# Patient Record
Sex: Female | Born: 1960 | Race: Black or African American | Hispanic: No | Marital: Married | Smoking: Never smoker
Health system: Southern US, Community
[De-identification: ages and names within clinical notes are randomized; demographics above are authoritative.]

## PROBLEM LIST (undated history)

## (undated) DIAGNOSIS — R7303 Prediabetes: Secondary | ICD-10-CM

## (undated) DIAGNOSIS — M549 Dorsalgia, unspecified: Secondary | ICD-10-CM

## (undated) DIAGNOSIS — R002 Palpitations: Secondary | ICD-10-CM

## (undated) DIAGNOSIS — R0602 Shortness of breath: Secondary | ICD-10-CM

## (undated) DIAGNOSIS — M2550 Pain in unspecified joint: Secondary | ICD-10-CM

## (undated) DIAGNOSIS — G4733 Obstructive sleep apnea (adult) (pediatric): Secondary | ICD-10-CM

## (undated) DIAGNOSIS — K5900 Constipation, unspecified: Secondary | ICD-10-CM

## (undated) DIAGNOSIS — K644 Residual hemorrhoidal skin tags: Secondary | ICD-10-CM

## (undated) DIAGNOSIS — R079 Chest pain, unspecified: Secondary | ICD-10-CM

## (undated) DIAGNOSIS — R5383 Other fatigue: Secondary | ICD-10-CM

## (undated) DIAGNOSIS — Z87442 Personal history of urinary calculi: Secondary | ICD-10-CM

## (undated) DIAGNOSIS — M797 Fibromyalgia: Secondary | ICD-10-CM

## (undated) DIAGNOSIS — K219 Gastro-esophageal reflux disease without esophagitis: Secondary | ICD-10-CM

## (undated) DIAGNOSIS — F329 Major depressive disorder, single episode, unspecified: Secondary | ICD-10-CM

## (undated) DIAGNOSIS — F419 Anxiety disorder, unspecified: Secondary | ICD-10-CM

## (undated) DIAGNOSIS — K648 Other hemorrhoids: Secondary | ICD-10-CM

## (undated) DIAGNOSIS — D219 Benign neoplasm of connective and other soft tissue, unspecified: Secondary | ICD-10-CM

## (undated) DIAGNOSIS — K5792 Diverticulitis of intestine, part unspecified, without perforation or abscess without bleeding: Secondary | ICD-10-CM

## (undated) DIAGNOSIS — K8020 Calculus of gallbladder without cholecystitis without obstruction: Secondary | ICD-10-CM

## (undated) DIAGNOSIS — G894 Chronic pain syndrome: Secondary | ICD-10-CM

## (undated) DIAGNOSIS — I10 Essential (primary) hypertension: Secondary | ICD-10-CM

## (undated) HISTORY — DX: Chronic pain syndrome: G89.4

## (undated) HISTORY — DX: Obstructive sleep apnea (adult) (pediatric): G47.33

## (undated) HISTORY — DX: Gastro-esophageal reflux disease without esophagitis: K21.9

## (undated) HISTORY — DX: Benign neoplasm of connective and other soft tissue, unspecified: D21.9

## (undated) HISTORY — DX: Constipation, unspecified: K59.00

## (undated) HISTORY — DX: Calculus of gallbladder without cholecystitis without obstruction: K80.20

## (undated) HISTORY — DX: Major depressive disorder, single episode, unspecified: F32.9

## (undated) HISTORY — DX: Dorsalgia, unspecified: M54.9

## (undated) HISTORY — PX: APPENDECTOMY: SHX54

## (undated) HISTORY — DX: Anxiety disorder, unspecified: F41.9

## (undated) HISTORY — DX: Pain in unspecified joint: M25.50

## (undated) HISTORY — DX: Shortness of breath: R06.02

## (undated) HISTORY — DX: Prediabetes: R73.03

## (undated) HISTORY — DX: Other fatigue: R53.83

## (undated) HISTORY — DX: Diverticulitis of intestine, part unspecified, without perforation or abscess without bleeding: K57.92

## (undated) HISTORY — DX: Personal history of urinary calculi: Z87.442

## (undated) HISTORY — DX: Morbid (severe) obesity due to excess calories: E66.01

## (undated) HISTORY — DX: Chest pain, unspecified: R07.9

## (undated) HISTORY — DX: Fibromyalgia: M79.7

## (undated) HISTORY — DX: Essential (primary) hypertension: I10

## (undated) HISTORY — DX: Palpitations: R00.2

## (undated) HISTORY — PX: DILATION AND CURETTAGE OF UTERUS: SHX78

---

## 1994-08-15 HISTORY — PX: TUBAL LIGATION: SHX77

## 2002-08-15 DIAGNOSIS — I10 Essential (primary) hypertension: Secondary | ICD-10-CM

## 2002-08-15 HISTORY — PX: COLONOSCOPY: SHX174

## 2002-08-15 HISTORY — DX: Essential (primary) hypertension: I10

## 2003-09-10 ENCOUNTER — Emergency Department (HOSPITAL_COMMUNITY): Admission: EM | Admit: 2003-09-10 | Discharge: 2003-09-10 | Payer: Self-pay | Admitting: Emergency Medicine

## 2003-09-30 ENCOUNTER — Encounter: Admission: RE | Admit: 2003-09-30 | Discharge: 2003-09-30 | Payer: Self-pay | Admitting: Obstetrics and Gynecology

## 2003-11-13 ENCOUNTER — Encounter: Admission: RE | Admit: 2003-11-13 | Discharge: 2003-11-13 | Payer: Self-pay | Admitting: Obstetrics and Gynecology

## 2003-11-27 ENCOUNTER — Encounter: Admission: RE | Admit: 2003-11-27 | Discharge: 2003-11-27 | Payer: Self-pay | Admitting: Internal Medicine

## 2004-09-14 ENCOUNTER — Encounter: Admission: RE | Admit: 2004-09-14 | Discharge: 2004-09-14 | Payer: Self-pay | Admitting: Obstetrics and Gynecology

## 2004-11-08 ENCOUNTER — Encounter: Admission: RE | Admit: 2004-11-08 | Discharge: 2004-11-08 | Payer: Self-pay | Admitting: Obstetrics and Gynecology

## 2005-09-15 ENCOUNTER — Encounter: Admission: RE | Admit: 2005-09-15 | Discharge: 2005-09-15 | Payer: Self-pay | Admitting: Internal Medicine

## 2006-08-03 ENCOUNTER — Ambulatory Visit (HOSPITAL_COMMUNITY): Admission: RE | Admit: 2006-08-03 | Discharge: 2006-08-03 | Payer: Self-pay | Admitting: Obstetrics and Gynecology

## 2007-04-12 ENCOUNTER — Emergency Department (HOSPITAL_COMMUNITY): Admission: EM | Admit: 2007-04-12 | Discharge: 2007-04-12 | Payer: Self-pay | Admitting: Emergency Medicine

## 2007-06-13 ENCOUNTER — Encounter: Admission: RE | Admit: 2007-06-13 | Discharge: 2007-06-13 | Payer: Self-pay | Admitting: Obstetrics and Gynecology

## 2008-06-24 ENCOUNTER — Encounter: Admission: RE | Admit: 2008-06-24 | Discharge: 2008-06-24 | Payer: Self-pay | Admitting: Obstetrics and Gynecology

## 2008-08-07 IMAGING — CR DG CHEST 1V PORT
1 series · 1 of 1 positions shown · non-contrast
Comparison: 11/27/03.

CLINICAL DATA: Left-sided chest pain.  
 PORTABLE CHEST - 1 VIEW:

[view not recorded]
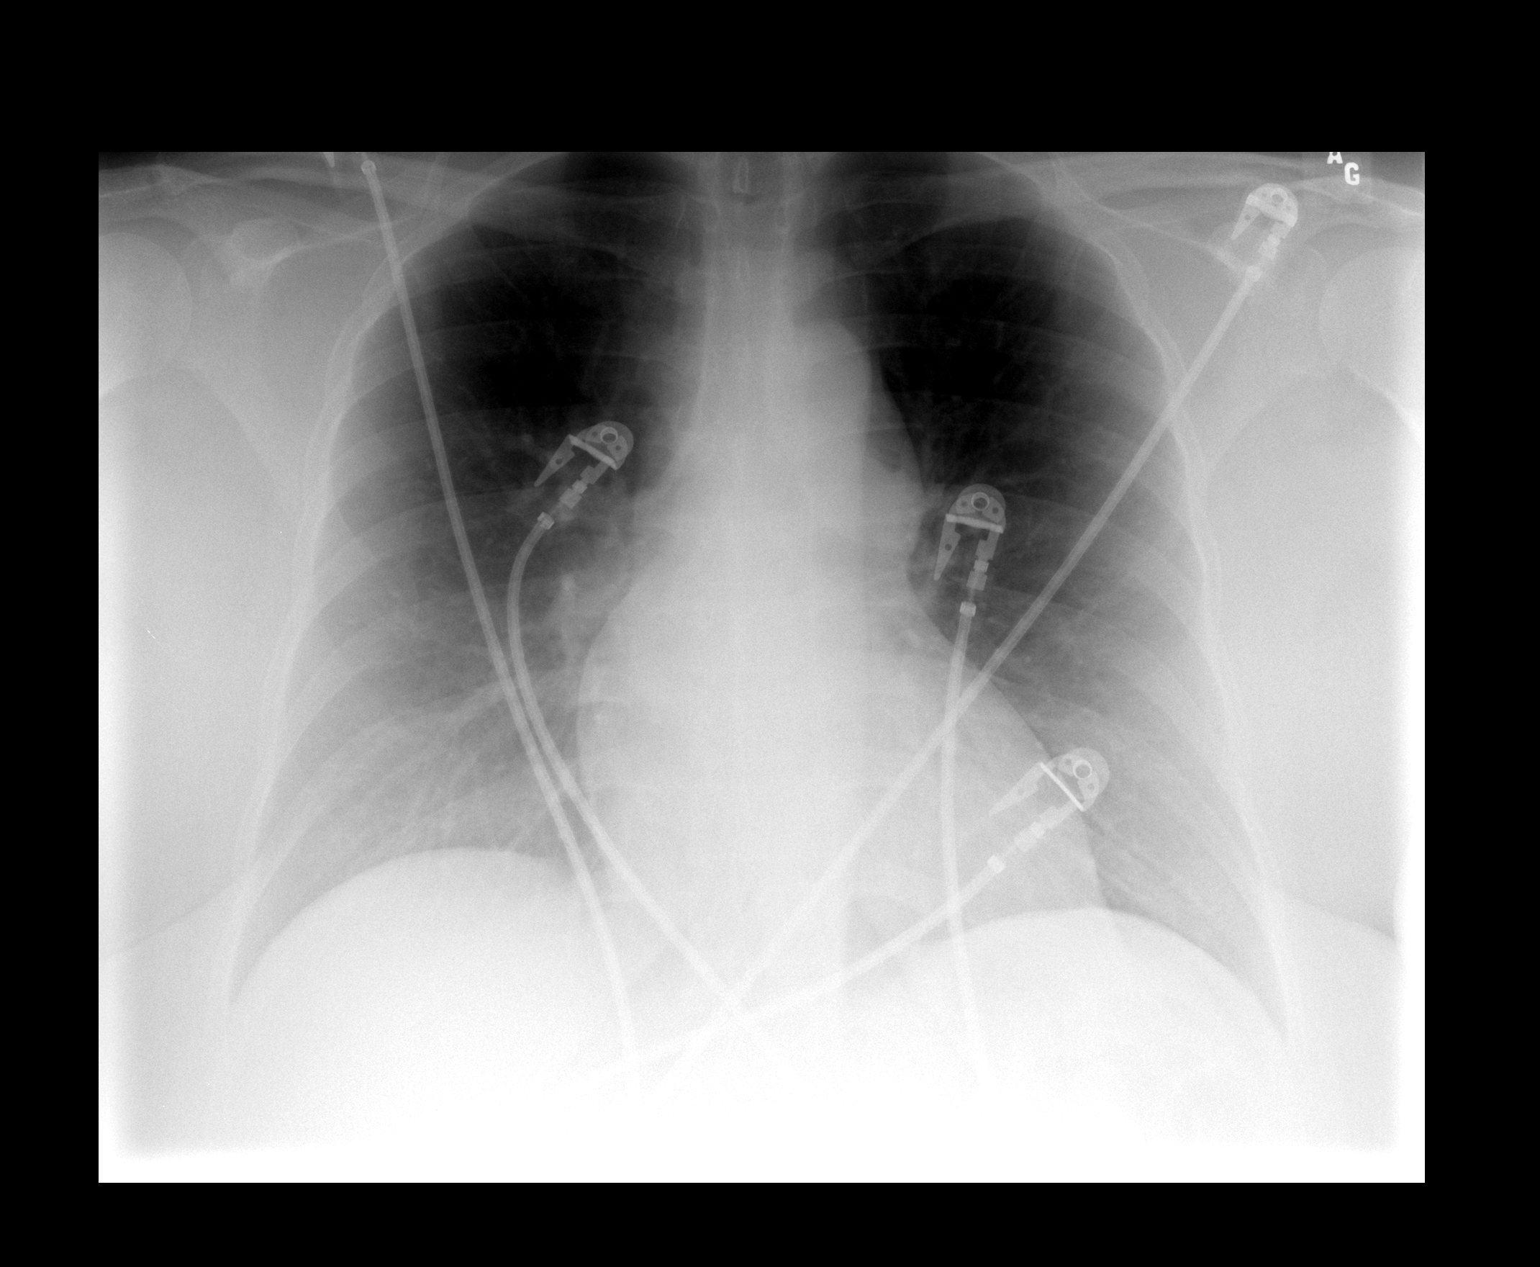

[1 of 1 positions shown; findings below may reference images not displayed]

FINDINGS: The heart size and mediastinal contours are within normal limits.  Both lungs are clear.
IMPRESSION: No acute disease.

## 2008-08-13 ENCOUNTER — Emergency Department (HOSPITAL_COMMUNITY): Admission: EM | Admit: 2008-08-13 | Discharge: 2008-08-14 | Payer: Self-pay | Admitting: Emergency Medicine

## 2008-08-15 DIAGNOSIS — D219 Benign neoplasm of connective and other soft tissue, unspecified: Secondary | ICD-10-CM

## 2008-08-15 DIAGNOSIS — M797 Fibromyalgia: Secondary | ICD-10-CM

## 2008-08-15 HISTORY — DX: Benign neoplasm of connective and other soft tissue, unspecified: D21.9

## 2008-08-15 HISTORY — DX: Fibromyalgia: M79.7

## 2009-10-05 ENCOUNTER — Encounter: Admission: RE | Admit: 2009-10-05 | Discharge: 2009-10-05 | Payer: Self-pay | Admitting: Obstetrics and Gynecology

## 2010-08-15 DIAGNOSIS — F32A Depression, unspecified: Secondary | ICD-10-CM

## 2010-08-15 DIAGNOSIS — F419 Anxiety disorder, unspecified: Secondary | ICD-10-CM

## 2010-08-15 HISTORY — DX: Anxiety disorder, unspecified: F41.9

## 2010-08-15 HISTORY — DX: Depression, unspecified: F32.A

## 2010-09-05 ENCOUNTER — Encounter: Payer: Self-pay | Admitting: Obstetrics and Gynecology

## 2011-01-18 ENCOUNTER — Other Ambulatory Visit: Payer: Self-pay | Admitting: Obstetrics and Gynecology

## 2011-01-18 DIAGNOSIS — Z1231 Encounter for screening mammogram for malignant neoplasm of breast: Secondary | ICD-10-CM

## 2011-01-27 ENCOUNTER — Ambulatory Visit
Admission: RE | Admit: 2011-01-27 | Discharge: 2011-01-27 | Disposition: A | Discharge status: Home / Self Care | Payer: BC Managed Care – PPO | Source: Ambulatory Visit | Attending: Obstetrics and Gynecology | Admitting: Obstetrics and Gynecology

## 2011-01-27 DIAGNOSIS — Z1231 Encounter for screening mammogram for malignant neoplasm of breast: Secondary | ICD-10-CM

## 2011-02-01 ENCOUNTER — Other Ambulatory Visit: Payer: Self-pay | Admitting: Obstetrics and Gynecology

## 2011-02-01 ENCOUNTER — Other Ambulatory Visit (HOSPITAL_COMMUNITY)
Admission: RE | Admit: 2011-02-01 | Discharge: 2011-02-01 | Disposition: A | Discharge status: Home / Self Care | Payer: BC Managed Care – PPO | Source: Ambulatory Visit | Attending: Obstetrics and Gynecology | Admitting: Obstetrics and Gynecology

## 2011-02-01 DIAGNOSIS — Z01419 Encounter for gynecological examination (general) (routine) without abnormal findings: Secondary | ICD-10-CM | POA: Insufficient documentation

## 2011-05-20 LAB — DIFFERENTIAL
Basophils Absolute: 0 10*3/uL (ref 0.0–0.1)
Basophils Relative: 1 % (ref 0–1)
Eosinophils Absolute: 0.1 10*3/uL (ref 0.0–0.7)
Eosinophils Relative: 1 % (ref 0–5)
Lymphocytes Relative: 31 % (ref 12–46)
Lymphs Abs: 2 10*3/uL (ref 0.7–4.0)
Monocytes Absolute: 0.5 10*3/uL (ref 0.1–1.0)
Monocytes Relative: 7 % (ref 3–12)
Neutro Abs: 3.9 10*3/uL (ref 1.7–7.7)
Neutrophils Relative %: 60 % (ref 43–77)

## 2011-05-20 LAB — COMPREHENSIVE METABOLIC PANEL
ALT: 20 U/L (ref 0–35)
AST: 22 U/L (ref 0–37)
Albumin: 3.6 g/dL (ref 3.5–5.2)
Alkaline Phosphatase: 97 U/L (ref 39–117)
BUN: 14 mg/dL (ref 6–23)
CO2: 27 mEq/L (ref 19–32)
Calcium: 9.7 mg/dL (ref 8.4–10.5)
Chloride: 102 mEq/L (ref 96–112)
Creatinine, Ser: 0.76 mg/dL (ref 0.4–1.2)
GFR calc Af Amer: >60 mL/min (ref >60)
GFR calc non Af Amer: >60 mL/min (ref >60)
Glucose, Bld: 114 mg/dL — ABNORMAL HIGH (ref 70–99)
Potassium: 3.8 mEq/L (ref 3.5–5.1)
Sodium: 140 mEq/L (ref 135–145)
Total Bilirubin: 0.8 mg/dL (ref 0.3–1.2)
Total Protein: 7.2 g/dL (ref 6.0–8.3)

## 2011-05-20 LAB — POCT I-STAT, CHEM 8
BUN: 15 mg/dL (ref 6–23)
Calcium, Ion: 1.16 mmol/L (ref 1.12–1.32)
Chloride: 104 mEq/L (ref 96–112)
Creatinine, Ser: 0.8 mg/dL (ref 0.4–1.2)
Glucose, Bld: 105 mg/dL — ABNORMAL HIGH (ref 70–99)
HCT: 41 % (ref 36.0–46.0)
Hemoglobin: 13.9 g/dL (ref 12.0–15.0)
Potassium: 3.7 mEq/L (ref 3.5–5.1)
Sodium: 140 mEq/L (ref 135–145)
TCO2: 26 mmol/L (ref 0–100)

## 2011-05-20 LAB — URINALYSIS, ROUTINE W REFLEX MICROSCOPIC
Bilirubin Urine: NEGATIVE
Glucose, UA: NEGATIVE mg/dL
Hgb urine dipstick: NEGATIVE
Ketones, ur: NEGATIVE mg/dL
Nitrite: NEGATIVE
Protein, ur: NEGATIVE mg/dL
Specific Gravity, Urine: 1.01 (ref 1.005–1.030)
Urobilinogen, UA: 0.2 mg/dL (ref 0.0–1.0)
pH: 6 (ref 5.0–8.0)

## 2011-05-20 LAB — POCT CARDIAC MARKERS
CKMB, poc: <1 ng/mL — ABNORMAL LOW (ref 1.0–8.0)
Myoglobin, poc: 29.4 ng/mL (ref 12–200)
Troponin i, poc: <0.05 ng/mL (ref 0.00–0.09)

## 2011-05-20 LAB — CBC
HCT: 39 % (ref 36.0–46.0)
Hemoglobin: 13.1 g/dL (ref 12.0–15.0)
MCHC: 33.5 g/dL (ref 30.0–36.0)
MCV: 77.2 fL — ABNORMAL LOW (ref 78.0–100.0)
Platelets: 335 10*3/uL (ref 150–400)
RBC: 5.06 MIL/uL (ref 3.87–5.11)
RDW: 14.6 % (ref 11.5–15.5)
WBC: 6.5 10*3/uL (ref 4.0–10.5)

## 2011-05-20 LAB — LIPASE, BLOOD: Lipase: 20 U/L (ref 11–59)

## 2011-05-27 LAB — APTT: aPTT: 30

## 2011-05-27 LAB — ETHANOL: Alcohol, Ethyl (B): <5

## 2011-05-27 LAB — I-STAT 8, (EC8 V) (CONVERTED LAB)
Acid-Base Excess: 3 — ABNORMAL HIGH
BUN: 14
Bicarbonate: 29 — ABNORMAL HIGH
Chloride: 104
Glucose, Bld: 101 — ABNORMAL HIGH
HCT: 44
Hemoglobin: 15
Operator id: 285491
Potassium: 4
Sodium: 138
TCO2: 30
pCO2, Ven: 46.6
pH, Ven: 7.402 — ABNORMAL HIGH

## 2011-05-27 LAB — DIFFERENTIAL
Basophils Absolute: 0
Basophils Relative: 0
Eosinophils Absolute: 0.1
Eosinophils Relative: 1
Lymphocytes Relative: 26
Lymphs Abs: 1.6
Monocytes Absolute: 0.5
Monocytes Relative: 8
Neutro Abs: 3.9
Neutrophils Relative %: 64

## 2011-05-27 LAB — CBC
HCT: 37.9
Hemoglobin: 12.5
MCHC: 33.1
MCV: 78.1
Platelets: 346
RBC: 4.85
RDW: 14.2 — ABNORMAL HIGH
WBC: 6.1

## 2011-05-27 LAB — POCT CARDIAC MARKERS
CKMB, poc: 1.4
CKMB, poc: <1 — ABNORMAL LOW
Myoglobin, poc: 49.3
Myoglobin, poc: 55.5
Operator id: 285491
Operator id: 285491
Troponin i, poc: <0.05
Troponin i, poc: <0.05

## 2011-05-27 LAB — POCT I-STAT CREATININE
Creatinine, Ser: 0.8
Operator id: 285491

## 2011-05-27 LAB — PROTIME-INR
INR: 0.9
Prothrombin Time: 12.5

## 2011-05-27 LAB — D-DIMER, QUANTITATIVE (NOT AT ARMC): D-Dimer, Quant: 0.23

## 2011-08-16 DIAGNOSIS — G4733 Obstructive sleep apnea (adult) (pediatric): Secondary | ICD-10-CM

## 2011-08-16 HISTORY — DX: Obstructive sleep apnea (adult) (pediatric): G47.33

## 2011-08-31 ENCOUNTER — Institutional Professional Consult (permissible substitution): Payer: BC Managed Care – PPO | Admitting: Pulmonary Disease

## 2011-09-02 ENCOUNTER — Institutional Professional Consult (permissible substitution): Payer: BC Managed Care – PPO | Admitting: Pulmonary Disease

## 2011-09-16 ENCOUNTER — Institutional Professional Consult (permissible substitution): Payer: BC Managed Care – PPO | Admitting: Pulmonary Disease

## 2011-10-05 ENCOUNTER — Encounter: Payer: Self-pay | Admitting: Pulmonary Disease

## 2011-10-06 ENCOUNTER — Institutional Professional Consult (permissible substitution): Payer: BC Managed Care – PPO | Admitting: Pulmonary Disease

## 2011-10-24 ENCOUNTER — Encounter: Payer: Self-pay | Admitting: Pulmonary Disease

## 2011-10-25 ENCOUNTER — Institutional Professional Consult (permissible substitution): Payer: BC Managed Care – PPO | Admitting: Pulmonary Disease

## 2013-02-13 ENCOUNTER — Encounter (HOSPITAL_COMMUNITY): Payer: Self-pay | Admitting: *Deleted

## 2013-02-13 ENCOUNTER — Emergency Department (HOSPITAL_COMMUNITY)
Admission: EM | Admit: 2013-02-13 | Discharge: 2013-02-13 | Disposition: A | Discharge status: Home / Self Care | Payer: BC Managed Care – PPO | Attending: Emergency Medicine | Admitting: Emergency Medicine

## 2013-02-13 DIAGNOSIS — G894 Chronic pain syndrome: Secondary | ICD-10-CM | POA: Insufficient documentation

## 2013-02-13 DIAGNOSIS — Z79899 Other long term (current) drug therapy: Secondary | ICD-10-CM | POA: Insufficient documentation

## 2013-02-13 DIAGNOSIS — I10 Essential (primary) hypertension: Secondary | ICD-10-CM | POA: Insufficient documentation

## 2013-02-13 DIAGNOSIS — K5900 Constipation, unspecified: Secondary | ICD-10-CM | POA: Insufficient documentation

## 2013-02-13 DIAGNOSIS — R1032 Left lower quadrant pain: Secondary | ICD-10-CM | POA: Insufficient documentation

## 2013-02-13 DIAGNOSIS — R109 Unspecified abdominal pain: Secondary | ICD-10-CM

## 2013-02-13 DIAGNOSIS — Z8669 Personal history of other diseases of the nervous system and sense organs: Secondary | ICD-10-CM | POA: Insufficient documentation

## 2013-02-13 DIAGNOSIS — Z8679 Personal history of other diseases of the circulatory system: Secondary | ICD-10-CM | POA: Insufficient documentation

## 2013-02-13 DIAGNOSIS — F3289 Other specified depressive episodes: Secondary | ICD-10-CM | POA: Insufficient documentation

## 2013-02-13 DIAGNOSIS — F329 Major depressive disorder, single episode, unspecified: Secondary | ICD-10-CM | POA: Insufficient documentation

## 2013-02-13 DIAGNOSIS — Z862 Personal history of diseases of the blood and blood-forming organs and certain disorders involving the immune mechanism: Secondary | ICD-10-CM | POA: Insufficient documentation

## 2013-02-13 DIAGNOSIS — K921 Melena: Secondary | ICD-10-CM | POA: Insufficient documentation

## 2013-02-13 LAB — URINALYSIS, ROUTINE W REFLEX MICROSCOPIC
Bilirubin Urine: NEGATIVE
Glucose, UA: NEGATIVE mg/dL
Hgb urine dipstick: NEGATIVE
Ketones, ur: NEGATIVE mg/dL
Leukocytes, UA: NEGATIVE
Nitrite: NEGATIVE
Protein, ur: NEGATIVE mg/dL
Specific Gravity, Urine: 1.009 (ref 1.005–1.030)
Urobilinogen, UA: 0.2 mg/dL (ref 0.0–1.0)
pH: 7 (ref 5.0–8.0)

## 2013-02-13 LAB — CBC WITH DIFFERENTIAL/PLATELET
Basophils Absolute: 0 10*3/uL (ref 0.0–0.1)
Basophils Relative: 0 % (ref 0–1)
Eosinophils Absolute: 0.1 10*3/uL (ref 0.0–0.7)
Eosinophils Relative: 1 % (ref 0–5)
HCT: 36.7 % (ref 36.0–46.0)
Hemoglobin: 11.5 g/dL — ABNORMAL LOW (ref 12.0–15.0)
Lymphocytes Relative: 29 % (ref 12–46)
Lymphs Abs: 1.3 10*3/uL (ref 0.7–4.0)
MCH: 24.3 pg — ABNORMAL LOW (ref 26.0–34.0)
MCHC: 31.3 g/dL (ref 30.0–36.0)
MCV: 77.4 fL — ABNORMAL LOW (ref 78.0–100.0)
Monocytes Absolute: 0.5 10*3/uL (ref 0.1–1.0)
Monocytes Relative: 11 % (ref 3–12)
Neutro Abs: 2.8 10*3/uL (ref 1.7–7.7)
Neutrophils Relative %: 59 % (ref 43–77)
Platelets: 261 10*3/uL (ref 150–400)
RBC: 4.74 MIL/uL (ref 3.87–5.11)
RDW: 13.9 % (ref 11.5–15.5)
WBC: 4.7 10*3/uL (ref 4.0–10.5)

## 2013-02-13 LAB — COMPREHENSIVE METABOLIC PANEL
ALT: 15 U/L (ref 0–35)
AST: 16 U/L (ref 0–37)
Albumin: 3.7 g/dL (ref 3.5–5.2)
Alkaline Phosphatase: 105 U/L (ref 39–117)
BUN: 7 mg/dL (ref 6–23)
CO2: 28 mEq/L (ref 19–32)
Calcium: 9.3 mg/dL (ref 8.4–10.5)
Chloride: 104 mEq/L (ref 96–112)
Creatinine, Ser: 0.73 mg/dL (ref 0.50–1.10)
GFR calc Af Amer: >90 mL/min (ref >90)
GFR calc non Af Amer: >90 mL/min (ref >90)
Glucose, Bld: 104 mg/dL — ABNORMAL HIGH (ref 70–99)
Potassium: 3.8 mEq/L (ref 3.5–5.1)
Sodium: 141 mEq/L (ref 135–145)
Total Bilirubin: 0.5 mg/dL (ref 0.3–1.2)
Total Protein: 7.5 g/dL (ref 6.0–8.3)

## 2013-02-13 LAB — POCT I-STAT TROPONIN I: Troponin i, poc: 0 ng/mL (ref 0.00–0.08)

## 2013-02-13 MED ORDER — METRONIDAZOLE 500 MG PO TABS: 500.0000 mg | ORAL_TABLET | Freq: Three times a day (TID) | ORAL | Status: DC

## 2013-02-13 MED ORDER — CIPROFLOXACIN HCL 500 MG PO TABS: 500.0000 mg | ORAL_TABLET | Freq: Two times a day (BID) | ORAL | Status: DC

## 2013-02-13 MED ORDER — OMEPRAZOLE 20 MG PO CPDR: 20.0000 mg | DELAYED_RELEASE_CAPSULE | Freq: Every day | ORAL | Status: DC

## 2013-02-13 NOTE — ED Notes (Signed)
Pt c/o rectal bleeding when having frequent stools, and when she has to strain. Pt also c/o abd pain x 2wks. Bleeding bright red in color.

## 2013-02-13 NOTE — ED Notes (Signed)
Pt ambulated back to room and then reported having chest pain on arrival to the room.

## 2013-02-13 NOTE — ED Provider Notes (Signed)
Medical screening examination/treatment/procedure(s) were performed by non-physician practitioner and as supervising physician I was immediately available for consultation/collaboration.  Liannah Yarbough, MD 02/13/13 1617 

## 2013-02-13 NOTE — ED Notes (Signed)
Discharge instructions reviewed. Pt verbalized understanding.  

## 2013-02-13 NOTE — ED Notes (Signed)
Pt reports having intermittent abd pains, also having lower and left side back pains, generalized abd pain and bright red rectal bleeding.

## 2013-02-13 NOTE — ED Provider Notes (Signed)
History    CSN: [id number] Arrival date & time 02/13/13  1141  First MD Initiated Contact with Patient 02/13/13 1232     Chief Complaint  Patient presents with  . Abdominal Pain  . Rectal Bleeding   (Consider location/radiation/quality/duration/timing/severity/associated sxs/prior Treatment) Patient is a 52 y.o. female presenting with abdominal pain and hematochezia. The history is provided by the patient. No language interpreter was used.  Abdominal Pain This is a new problem. Associated symptoms include abdominal pain. Pertinent negatives include no chills, fever, nausea or vomiting. Associated symptoms comments: Pain limited to epigastrium for the past 2 weeks associated with bloating. No nausea or vomiting, fever or diarrhea. She has seen bright red blood in her normal color stool with a history of external hemorrhoids. No cough, chest pain, SOB. Marland Kitchen Nothing aggravates the symptoms.  Rectal Bleeding Associated symptoms: abdominal pain   Associated symptoms: no fever and no vomiting    Past Medical History  Diagnosis Date  . Fibromyalgia   . Hypertension   . Chronic pain syndrome   . OSA (obstructive sleep apnea)   . Morbid obesity   . Depression   . Anemia    Past Surgical History  Procedure Laterality Date  . Appendectomy     Family History  Problem Relation Age of Onset  . Hypertension Father   . Alzheimer's disease Father   . Heart disease Father   . Mental illness Mother   . Breast cancer Sister    History  Substance Use Topics  . Smoking status: Not on file  . Smokeless tobacco: Not on file  . Alcohol Use: No   OB History   Grav Para Term Preterm Abortions TAB SAB Ect Mult Living                 Review of Systems  Constitutional: Negative for fever, chills and appetite change.  HENT: Negative.   Respiratory: Negative.   Cardiovascular: Negative.   Gastrointestinal: Positive for abdominal pain, constipation, blood in stool and hematochezia. Negative  for nausea, vomiting and diarrhea.  Genitourinary: Negative for dysuria and flank pain.  Musculoskeletal: Negative.  Negative for back pain.  Skin: Negative.   Neurological: Negative.     Allergies  Codeine  Home Medications   Current Outpatient Rx  Name  Route  Sig  Dispense  Refill  . furosemide (LASIX) 40 MG tablet   Oral   Take 40 mg by mouth daily.         Marland Kitchen HYDROcodone-acetaminophen (NORCO) 5-325 MG per tablet   Oral   Take 1 tablet by mouth every 8 (eight) hours as needed for pain.          Marland Kitchen LORazepam (ATIVAN) 0.5 MG tablet   Oral   Take 0.5 mg by mouth 2 (two) times daily.          . Melatonin 5 MG CAPS   Oral   Take 1 capsule by mouth at bedtime.         . metoprolol succinate (TOPROL-XL) 50 MG 24 hr tablet   Oral   Take 50 mg by mouth daily. Take with or immediately following a meal.         . Multiple Vitamin (MULTIVITAMIN) capsule   Oral   Take 1 capsule by mouth daily.          BP 142/88  Pulse 67  Temp(Src) 98.3 F (36.8 C) (Oral)  Resp 15  SpO2 98% Physical Exam  Constitutional: She  is oriented to person, place, and time. She appears well-developed and well-nourished.  HENT:  Head: Normocephalic.  Neck: Normal range of motion. Neck supple.  Cardiovascular: Normal rate and regular rhythm.   Pulmonary/Chest: Effort normal and breath sounds normal.  Abdominal: Soft. Bowel sounds are normal. There is tenderness. There is no rebound and no guarding.  Epigastric tenderness that is mild. She has LLQ abdominal pain with guarding. BS active. No notable distention to soft abdomen.   Musculoskeletal: Normal range of motion.  Neurological: She is alert and oriented to person, place, and time.  Skin: Skin is warm and dry. No rash noted.  Psychiatric: She has a normal mood and affect.    ED Course  Procedures (including critical care time) Labs Reviewed  CBC WITH DIFFERENTIAL - Abnormal; Notable for the following:    Hemoglobin 11.5 (*)     MCV 77.4 (*)    MCH 24.3 (*)    All other components within normal limits  COMPREHENSIVE METABOLIC PANEL - Abnormal; Notable for the following:    Glucose, Bld 104 (*)    All other components within normal limits  URINALYSIS, ROUTINE W REFLEX MICROSCOPIC  POCT I-STAT TROPONIN I   Results for orders placed during the hospital encounter of 02/13/13  CBC WITH DIFFERENTIAL      Result Value Range   WBC 4.7  4.0 - 10.5 K/uL   RBC 4.74  3.87 - 5.11 MIL/uL   Hemoglobin 11.5 (*) 12.0 - 15.0 g/dL   HCT 16.1  09.6 - 04.5 %   MCV 77.4 (*) 78.0 - 100.0 fL   MCH 24.3 (*) 26.0 - 34.0 pg   MCHC 31.3  30.0 - 36.0 g/dL   RDW 40.9  81.1 - 91.4 %   Platelets 261  150 - 400 K/uL   Neutrophils Relative % 59  43 - 77 %   Neutro Abs 2.8  1.7 - 7.7 K/uL   Lymphocytes Relative 29  12 - 46 %   Lymphs Abs 1.3  0.7 - 4.0 K/uL   Monocytes Relative 11  3 - 12 %   Monocytes Absolute 0.5  0.1 - 1.0 K/uL   Eosinophils Relative 1  0 - 5 %   Eosinophils Absolute 0.1  0.0 - 0.7 K/uL   Basophils Relative 0  0 - 1 %   Basophils Absolute 0.0  0.0 - 0.1 K/uL  COMPREHENSIVE METABOLIC PANEL      Result Value Range   Sodium 141  135 - 145 mEq/L   Potassium 3.8  3.5 - 5.1 mEq/L   Chloride 104  96 - 112 mEq/L   CO2 28  19 - 32 mEq/L   Glucose, Bld 104 (*) 70 - 99 mg/dL   BUN 7  6 - 23 mg/dL   Creatinine, Ser 7.82  0.50 - 1.10 mg/dL   Calcium 9.3  8.4 - 95.6 mg/dL   Total Protein 7.5  6.0 - 8.3 g/dL   Albumin 3.7  3.5 - 5.2 g/dL   AST 16  0 - 37 U/L   ALT 15  0 - 35 U/L   Alkaline Phosphatase 105  39 - 117 U/L   Total Bilirubin 0.5  0.3 - 1.2 mg/dL   GFR calc non Af Amer >90  >90 mL/min   GFR calc Af Amer >90  >90 mL/min  URINALYSIS, ROUTINE W REFLEX MICROSCOPIC      Result Value Range   Color, Urine YELLOW  YELLOW   APPearance CLEAR  CLEAR   Specific Gravity, Urine 1.009  1.005 - 1.030   pH 7.0  5.0 - 8.0   Glucose, UA NEGATIVE  NEGATIVE mg/dL   Hgb urine dipstick NEGATIVE  NEGATIVE   Bilirubin Urine  NEGATIVE  NEGATIVE   Ketones, ur NEGATIVE  NEGATIVE mg/dL   Protein, ur NEGATIVE  NEGATIVE mg/dL   Urobilinogen, UA 0.2  0.0 - 1.0 mg/dL   Nitrite NEGATIVE  NEGATIVE   Leukocytes, UA NEGATIVE  NEGATIVE  POCT I-STAT TROPONIN I      Result Value Range   Troponin i, poc 0.00  0.00 - 0.08 ng/mL   Comment 3             No results found. No diagnosis found. 1. Abdominal pain MDM  LLQ pain on exam with history of diverticulitis. With no fever, N, V or diarrhea feel this is stable to treat as outpatient with Cipro and Flagyl. Will refer to GI. Recommend Prilosec for complaint of epigastric symptoms and bloating. Discussed symptoms that would prompt return to ED.  Arnoldo Hooker, PA-C 02/13/13 1422

## 2013-02-13 NOTE — ED Notes (Signed)
PT reports rectal bleeding today.

## 2013-04-05 ENCOUNTER — Encounter: Payer: Self-pay | Admitting: Nurse Practitioner

## 2013-04-05 ENCOUNTER — Ambulatory Visit (INDEPENDENT_AMBULATORY_CARE_PROVIDER_SITE_OTHER): Payer: BC Managed Care – PPO | Admitting: Nurse Practitioner

## 2013-04-05 VITALS — BP 142/68 | HR 76 | Resp 14 | Ht 59.0 in | Wt 206.8 lb

## 2013-04-05 DIAGNOSIS — M25559 Pain in unspecified hip: Secondary | ICD-10-CM

## 2013-04-05 DIAGNOSIS — Z0000 Encounter for general adult medical examination without abnormal findings: Secondary | ICD-10-CM

## 2013-04-05 DIAGNOSIS — Z01419 Encounter for gynecological examination (general) (routine) without abnormal findings: Secondary | ICD-10-CM

## 2013-04-05 LAB — POCT URINALYSIS DIPSTICK
Leukocytes, UA: NEGATIVE
Spec Grav, UA: 1.025
Urobilinogen, UA: NEGATIVE
pH, UA: 6

## 2013-04-05 NOTE — Patient Instructions (Signed)

## 2013-04-05 NOTE — Progress Notes (Signed)
51 y.o. G3, P3. Married Caucasian Fe here for annual exam.  LMP about 3 years. No vaso symptoms." Warm ' in general. No vaginal dryness. Pain in both lower quadrants that is intermittent and varies between right and left. Pain is dull achy and seems 'like ovarian pain'.  Noted  about 2 months ago. She also  Has history of uterine fibroids. She is also concerned about a flare of hemorrhoids and rectal bleeding in July related to episode of diverticulitis.  She needs another colonoscopy scheduled. She had appointment with Dr. Loreta Ave a few months ago and was getting ready to have done when she had chest pain. They cancelled the procedure and she saw the cardiologist and everything checked out normal. Thought episode was related to anxiety. She has not yet rescheduled to go back.  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 Daughter is patient here.  No LMP recorded. Patient is postmenopausal.          Sexually active: no  The current method of family planning is post menopausal status.    Exercising: no  The patient does not participate in regular exercise at present. Smoker:  no  Health Maintenance: Pap:  2011 normal (no history of abnormal) MMG:  1/ 2014 normal Colonoscopy:  2004 BMD:   2014 TDaP:  2011 Labs: PCP maintains all labs.    reports that she has never smoked. She has never used smokeless tobacco. She reports that she does not drink alcohol or use illicit drugs.  Past Medical History  Diagnosis Date  . Fibromyalgia   . Hypertension   . Chronic pain syndrome   . OSA (obstructive sleep apnea)   . Morbid obesity   . Depression   . Anemia   . Diverticulitis   . Anxiety     managed with medications   . Fibroid     uterine fibroids    Past Surgical History  Procedure Laterality Date  . Appendectomy    . Tubal ligation Bilateral   . Colonoscopy    . Colposcopy    . Vaginal delivery      x3    Current Outpatient Prescriptions  Medication Sig Dispense Refill  .  furosemide (LASIX) 40 MG tablet Take 40 mg by mouth daily.      Marland Kitchen HYDROcodone-acetaminophen (NORCO) 5-325 MG per tablet Take 1 tablet by mouth every 8 (eight) hours as needed for pain.       Marland Kitchen LORazepam (ATIVAN) 0.5 MG tablet Take 0.5 mg by mouth 2 (two) times daily.       . metoprolol succinate (TOPROL-XL) 50 MG 24 hr tablet Take 50 mg by mouth daily. Take with or immediately following a meal.      . omeprazole (PRILOSEC) 20 MG capsule Take 1 capsule (20 mg total) by mouth daily.  20 capsule  0   No current facility-administered medications for this visit.    Family History  Problem Relation Age of Onset  . Hypertension Father   . Alzheimer's disease Father   . Heart disease Father   . Diabetes Father   . Mental illness Mother   . Diabetes Mother   . Breast cancer Sister   . Diabetes Sister     ROS:  Pertinent items are noted in HPI.  Otherwise, a comprehensive ROS was negative.  Exam:   BP 142/68  Pulse 76  Resp 14  Ht 4\' 11"  (1.499 m)  Wt 206 lb 12.8 oz (93.804 kg)  BMI  41.75 kg/m2 Height: 4\' 11"  (149.9 cm)  Ht Readings from Last 3 Encounters:  04/05/13 4\' 11"  (1.499 m)    General appearance: alert, cooperative and appears stated age Head: Normocephalic, without obvious abnormality, atraumatic Neck: no adenopathy, supple, symmetrical, trachea midline and thyroid normal to inspection and palpation Lungs: clear to auscultation bilaterally Breasts: normal appearance, no masses or tenderness Heart: regular rate and rhythm Abdomen: soft, non-tender; no masses,  no organomegaly Extremities: extremities normal, atraumatic, no cyanosis or edema Skin: Skin color, texture, turgor normal. No rashes or lesions Lymph nodes: Cervical, supraclavicular, and axillary nodes normal. No abnormal inguinal nodes palpated Neurologic: Grossly normal   Pelvic: External genitalia:  no lesions              Urethra:  normal appearing urethra with no masses, tenderness or lesions               Bartholin's and Skene's: normal                 Vagina: normal appearing vagina with normal color and discharge, no lesions              Cervix: anteverted              Pap taken: yes Bimanual Exam:  Uterus:upper  normal size, contour, position, consistency, mobility, non-tender              Adnexa: no mass, fullness, tenderness               Rectovaginal: Confirms               Anus:  normal sphincter tone, no lesions  A:  Well Woman with normal exam  Menopausal with history of lower pelvic pain X 2  months  History of uterine fibroids  History of diverticulitis - flare in July  [facility] breast cancer with sister   P:   Pap smear as per guidelines Done today  Mammogram due 1/ 2015  Will get PUS/ maybe SHGM to evaluate pelvic pain  If negative she will go back to see Dr. Loreta Ave  Information given about BRCA testing  Counseled on breast self exam, adequate intake of calcium and vitamin D, diet and exercise return annually or prn  An After Visit Summary was printed and given to the patient.

## 2013-04-06 NOTE — Progress Notes (Signed)
Encounter reviewed by Dr. Brook Silva.  

## 2013-04-09 LAB — IPS PAP TEST WITH HPV

## 2013-04-15 ENCOUNTER — Other Ambulatory Visit: Payer: Self-pay | Admitting: Obstetrics & Gynecology

## 2013-04-15 DIAGNOSIS — Z1211 Encounter for screening for malignant neoplasm of colon: Secondary | ICD-10-CM

## 2013-04-15 DIAGNOSIS — N9489 Other specified conditions associated with female genital organs and menstrual cycle: Secondary | ICD-10-CM

## 2013-04-16 ENCOUNTER — Other Ambulatory Visit: Payer: Self-pay | Admitting: Nurse Practitioner

## 2013-04-16 DIAGNOSIS — N9489 Other specified conditions associated with female genital organs and menstrual cycle: Secondary | ICD-10-CM

## 2013-04-18 ENCOUNTER — Ambulatory Visit (INDEPENDENT_AMBULATORY_CARE_PROVIDER_SITE_OTHER): Payer: BC Managed Care – PPO | Admitting: Obstetrics & Gynecology

## 2013-04-18 ENCOUNTER — Ambulatory Visit (INDEPENDENT_AMBULATORY_CARE_PROVIDER_SITE_OTHER): Payer: BC Managed Care – PPO

## 2013-04-18 DIAGNOSIS — N9489 Other specified conditions associated with female genital organs and menstrual cycle: Secondary | ICD-10-CM

## 2013-04-18 DIAGNOSIS — R1032 Left lower quadrant pain: Secondary | ICD-10-CM

## 2013-04-18 DIAGNOSIS — D259 Leiomyoma of uterus, unspecified: Secondary | ICD-10-CM

## 2013-04-18 NOTE — Progress Notes (Signed)
52 y.o.Marriedfemale here for a pelvic ultrasound.  H/O LLQ pain in pt with hx of diverticulitis.  Also hx of uterine fibroids.  No vaginal bleeding.  No LMP recorded. Patient is postmenopausal.  Sexually active:  no  Contraception: PMP  FINDINGS: UTERUS:  8.4 x 7.7 x 4.7cm with multiple fibroids (6), largest measured 3cm EMS:  4.61mm ADNEXA:   Left ovary 2.2 x 2.0 x 0.9cm, appear atrophic   Right ovary 3.0 x 2.3 x 1.8cm, appear atrohpic CUL DE SAC: no free fluid  Images reviewed with pt and findings discussed.  She has known hx of fibroids and no recent bleeding.  With current size, doubtful these are growing.  Would be nice to have prior comparison.  She reports having ultrasound in past.  Will try to get records.  Assessment:  LLQ pain, known fibroids that are present on u/s today, largest 3cm Plan: obtain records.  If stable or smaller and pain continues, pt will follow-up with Dr. Loreta Ave.  Pt in agreement with plan.  ~15 minutes spent with patient >50% of time was in face to face discussion of above.

## 2013-04-23 ENCOUNTER — Ambulatory Visit: Payer: Self-pay | Admitting: Nurse Practitioner

## 2013-04-25 ENCOUNTER — Encounter: Payer: Self-pay | Admitting: Obstetrics & Gynecology

## 2013-04-25 DIAGNOSIS — D259 Leiomyoma of uterus, unspecified: Secondary | ICD-10-CM | POA: Insufficient documentation

## 2013-04-25 NOTE — Patient Instructions (Signed)
We will call once we have your records to inform of any size change with fibroids/uterus.

## 2013-05-24 ENCOUNTER — Emergency Department (HOSPITAL_COMMUNITY)
Admission: EM | Admit: 2013-05-24 | Discharge: 2013-05-24 | Disposition: A | Discharge status: Home / Self Care | Payer: BC Managed Care – PPO | Attending: Emergency Medicine | Admitting: Emergency Medicine

## 2013-05-24 ENCOUNTER — Encounter (HOSPITAL_COMMUNITY): Payer: Self-pay | Admitting: Emergency Medicine

## 2013-05-24 DIAGNOSIS — F411 Generalized anxiety disorder: Secondary | ICD-10-CM | POA: Insufficient documentation

## 2013-05-24 DIAGNOSIS — Z79899 Other long term (current) drug therapy: Secondary | ICD-10-CM | POA: Insufficient documentation

## 2013-05-24 DIAGNOSIS — Z8742 Personal history of other diseases of the female genital tract: Secondary | ICD-10-CM | POA: Insufficient documentation

## 2013-05-24 DIAGNOSIS — K922 Gastrointestinal hemorrhage, unspecified: Secondary | ICD-10-CM | POA: Insufficient documentation

## 2013-05-24 DIAGNOSIS — IMO0001 Reserved for inherently not codable concepts without codable children: Secondary | ICD-10-CM | POA: Insufficient documentation

## 2013-05-24 DIAGNOSIS — K648 Other hemorrhoids: Secondary | ICD-10-CM | POA: Insufficient documentation

## 2013-05-24 DIAGNOSIS — K5900 Constipation, unspecified: Secondary | ICD-10-CM | POA: Insufficient documentation

## 2013-05-24 DIAGNOSIS — G894 Chronic pain syndrome: Secondary | ICD-10-CM | POA: Insufficient documentation

## 2013-05-24 DIAGNOSIS — I10 Essential (primary) hypertension: Secondary | ICD-10-CM | POA: Insufficient documentation

## 2013-05-24 DIAGNOSIS — K644 Residual hemorrhoidal skin tags: Secondary | ICD-10-CM | POA: Insufficient documentation

## 2013-05-24 DIAGNOSIS — Z8669 Personal history of other diseases of the nervous system and sense organs: Secondary | ICD-10-CM | POA: Insufficient documentation

## 2013-05-24 DIAGNOSIS — K921 Melena: Secondary | ICD-10-CM | POA: Insufficient documentation

## 2013-05-24 HISTORY — DX: Residual hemorrhoidal skin tags: K64.4

## 2013-05-24 HISTORY — DX: Other hemorrhoids: K64.8

## 2013-05-24 LAB — POCT I-STAT, CHEM 8
BUN: 16 mg/dL (ref 6–23)
Calcium, Ion: 1.2 mmol/L (ref 1.12–1.23)
Chloride: 106 mEq/L (ref 96–112)
Creatinine, Ser: 1.1 mg/dL (ref 0.50–1.10)
Glucose, Bld: 96 mg/dL (ref 70–99)
HCT: 35 % — ABNORMAL LOW (ref 36.0–46.0)
Hemoglobin: 11.9 g/dL — ABNORMAL LOW (ref 12.0–15.0)
Potassium: 3.9 mEq/L (ref 3.5–5.1)
Sodium: 142 mEq/L (ref 135–145)
TCO2: 25 mmol/L (ref 0–100)

## 2013-05-24 LAB — CBC WITH DIFFERENTIAL/PLATELET
Basophils Absolute: 0 10*3/uL (ref 0.0–0.1)
Basophils Relative: 0 % (ref 0–1)
Eosinophils Absolute: 0.1 10*3/uL (ref 0.0–0.7)
Eosinophils Relative: 2 % (ref 0–5)
HCT: 34.4 % — ABNORMAL LOW (ref 36.0–46.0)
Hemoglobin: 10.8 g/dL — ABNORMAL LOW (ref 12.0–15.0)
Lymphocytes Relative: 33 % (ref 12–46)
Lymphs Abs: 1.5 10*3/uL (ref 0.7–4.0)
MCH: 24.4 pg — ABNORMAL LOW (ref 26.0–34.0)
MCHC: 31.4 g/dL (ref 30.0–36.0)
MCV: 77.8 fL — ABNORMAL LOW (ref 78.0–100.0)
Monocytes Absolute: 0.4 10*3/uL (ref 0.1–1.0)
Monocytes Relative: 9 % (ref 3–12)
Neutro Abs: 2.5 10*3/uL (ref 1.7–7.7)
Neutrophils Relative %: 56 % (ref 43–77)
Platelets: 271 10*3/uL (ref 150–400)
RBC: 4.42 MIL/uL (ref 3.87–5.11)
RDW: 14.5 % (ref 11.5–15.5)
WBC: 4.5 10*3/uL (ref 4.0–10.5)

## 2013-05-24 LAB — OCCULT BLOOD, POC DEVICE: Fecal Occult Bld: POSITIVE — AB

## 2013-05-24 NOTE — ED Provider Notes (Signed)
Medical screening examination/treatment/procedure(s) were conducted as a shared visit with non-physician practitioner(s) and myself.  I personally evaluated the patient during the encounter  VSS. H/H stable. Benign abs.   D/w GI who will see next week.  ER precautions given.   Darlys Gales, MD 05/24/13 2203

## 2013-05-24 NOTE — ED Notes (Signed)
Reports blood was "pouring out" of bottom. States episode of bleeding yesterday eventually stopped, then started again this morning. Denies any abd pain. Also c/o hemorrhoids acting up lately.

## 2013-05-24 NOTE — ED Provider Notes (Signed)
CSN: [id number]     Arrival date & time 05/24/13  8119 History   First MD Initiated Contact with Patient 05/24/13 [phone]     Chief Complaint  Patient presents with  . Rectal Bleeding   (Consider location/radiation/quality/duration/timing/severity/associated sxs/prior Treatment) HPI  52 year old female, morbidly obese, with history of fibromyalgia, diverticulitis, and external and internal hemorrhoid presents for evaluations of rectal bleeding. Patient states she has trouble with constipation and recently has been taking magnesium to help with the constipation. She also has a history of internal and external hemorrhoids with occasional rectal bleeding, when she wipes. Yesterday afternoon patient had a small bowel movement follows with "excessive rectal bleeding". Patient quantify as one third of a cup. She did notice mild tenderness when she wipes. This morning she felt a pressure sensation to her low abdomen, had another bout of bright red blood per rectum, less intense than last night, without any significant abdominal or rectal pain. Patient denies fever, chills, lightheadedness, dizziness, chest pain, shortness of breath, abnormal pain, back pain, dysuria, melena, or rash. She denies any recent trauma. She had a colonoscopy 11 years ago when she was diagnosed with diverticulitis, but recall having left lower quadrant abdominal pain at that time. She has no history of cancer denies any abnormal weight changes, fever, myalgia, or night sweats. No specific treatment tried.  Denies chronic NSAIDs use.  Past Medical History  Diagnosis Date  . Fibromyalgia 2010  . Chronic pain syndrome   . OSA (obstructive sleep apnea) 2013    no C Pap  . Morbid obesity   . Diverticulitis   . Anxiety 2012    managed with medications   . Depression 2012  . Fibroid 2010    uterine fibroids  . Hypertension 2004  . External hemorrhoid   . Hemorrhoids, internal    Past Surgical History  Procedure Laterality Date   . Colonoscopy  2004  . Vaginal delivery      x3  . Appendectomy  age 41  . Tubal ligation Bilateral 1996   Family History  Problem Relation Age of Onset  . Hypertension Father   . Alzheimer's disease Father   . Heart disease Father   . Diabetes Father   . Mental illness Mother   . Diabetes Mother   . Breast cancer Sister 16    chemo, mastectomy on left  . Diabetes Sister    History  Substance Use Topics  . Smoking status: Never Smoker   . Smokeless tobacco: Never Used  . Alcohol Use: Yes     Comment: occasional   OB History   Grav Para Term Preterm Abortions TAB SAB Ect Mult Living   3 3        3      Review of Systems  All other systems reviewed and are negative.    Allergies  Codeine  Home Medications   Current Outpatient Rx  Name  Route  Sig  Dispense  Refill  . furosemide (LASIX) 40 MG tablet   Oral   Take 40 mg by mouth daily.         Marland Kitchen HYDROcodone-acetaminophen (NORCO) 5-325 MG per tablet   Oral   Take 1 tablet by mouth every 8 (eight) hours as needed for pain.          Marland Kitchen LORazepam (ATIVAN) 0.5 MG tablet   Oral   Take 0.5 mg by mouth 2 (two) times daily.          . metoprolol succinate (  TOPROL-XL) 50 MG 24 hr tablet   Oral   Take 50 mg by mouth daily. Take with or immediately following a meal.         . omeprazole (PRILOSEC) 20 MG capsule   Oral   Take 1 capsule (20 mg total) by mouth daily.   20 capsule   0    BP 162/95  Pulse 67  Temp(Src) 98.3 F (36.8 C) (Oral)  Resp 20  Ht 5' (1.524 m)  Wt 204 lb (92.534 kg)  BMI 39.84 kg/m2  SpO2 99% Physical Exam  Nursing note and vitals reviewed. Constitutional: She is oriented to person, place, and time. She appears well-developed and well-nourished. No distress.  Awake, alert, nontoxic appearance  HENT:  Head: Atraumatic.  Eyes: Conjunctivae are normal. Right eye exhibits no discharge. Left eye exhibits no discharge.  Neck: Neck supple.  Cardiovascular: Normal rate and regular  rhythm.   Pulmonary/Chest: Effort normal. No respiratory distress. She exhibits no tenderness.  Abdominal: Soft. Bowel sounds are normal. There is no tenderness. There is no rebound and no guarding.  Genitourinary:  Chaperone present:  On rectal exam, normal rectal tone, no mass, some evidence of external hemorrhoid that is not thrombosed. Brown stools with streaks of bright red blood but no active bleeding. No significant rectal tenderness on exam.  Musculoskeletal: She exhibits no tenderness.  ROM appears intact, no obvious focal weakness  Neurological: She is alert and oriented to person, place, and time.  Mental status and motor strength appears intact  Skin: No rash noted.  Psychiatric: She has a normal mood and affect.    ED Course  Procedures (including critical care time)  10:20 AM Patient here with bright red blood per rectum. Likely lower GI bleed. No significant abdominal pain at this time to suggest diverticulitis and no obvious systemic complaint. NO obvious sxs to suggest cancer. Is afebrile.  Hemoccult is positive. Care discussed with attending.  11:25 AM Hemoglobin 11.9, at baseline.  Normal electrolytes.  No rectal bleeding in ER.  Pt likely need colonoscopy, non emergence.  Will consult GI specialist.    11:59 AM I have consulted GI specialist, Dr. Elnoria Howard who agrees that pt is stable for discharge and will have close follow up next week for further management.  Pt agrees with plan. Pt understand to return promptly if condition worsen.    Labs Review Labs Reviewed  CBC WITH DIFFERENTIAL - Abnormal; Notable for the following:    Hemoglobin 10.8 (*)    HCT 34.4 (*)    MCV 77.8 (*)    MCH 24.4 (*)    All other components within normal limits  OCCULT BLOOD, POC DEVICE - Abnormal; Notable for the following:    Fecal Occult Bld POSITIVE (*)    All other components within normal limits  POCT I-STAT, CHEM 8 - Abnormal; Notable for the following:    Hemoglobin 11.9 (*)     HCT 35.0 (*)    All other components within normal limits   Imaging Review No results found.  EKG Interpretation   None       MDM   1. Lower GI bleeding    BP 158/78  Pulse 62  Temp(Src) 98.3 F (36.8 C) (Oral)  Resp 20  Ht 5' (1.524 m)  Wt 204 lb (92.534 kg)  BMI 39.84 kg/m2  SpO2 99%  I have reviewed nursing notes and vital signs.   I reviewed available ER/hospitalization records thought the EMR  Fayrene Helper, PA-C 05/24/13 1246

## 2013-05-24 NOTE — ED Notes (Signed)
Hemoccult collected, tested & resulted positive by B. Laveda Norman, [place]

## 2013-05-24 NOTE — ED Notes (Signed)
C/o episode of rectal bleeding with BM yesterday & this morning.

## 2013-06-20 ENCOUNTER — Other Ambulatory Visit: Payer: Self-pay

## 2013-11-27 ENCOUNTER — Encounter: Payer: Self-pay | Admitting: Obstetrics & Gynecology

## 2014-04-07 ENCOUNTER — Encounter: Payer: Self-pay | Admitting: Nurse Practitioner

## 2014-04-07 ENCOUNTER — Ambulatory Visit (INDEPENDENT_AMBULATORY_CARE_PROVIDER_SITE_OTHER): Payer: BC Managed Care – PPO | Admitting: Nurse Practitioner

## 2014-04-07 VITALS — BP 108/80 | HR 60 | Ht 59.0 in | Wt 206.0 lb

## 2014-04-07 DIAGNOSIS — Z113 Encounter for screening for infections with a predominantly sexual mode of transmission: Secondary | ICD-10-CM

## 2014-04-07 DIAGNOSIS — R319 Hematuria, unspecified: Secondary | ICD-10-CM

## 2014-04-07 DIAGNOSIS — Z0000 Encounter for general adult medical examination without abnormal findings: Secondary | ICD-10-CM

## 2014-04-07 DIAGNOSIS — Z01419 Encounter for gynecological examination (general) (routine) without abnormal findings: Secondary | ICD-10-CM

## 2014-04-07 DIAGNOSIS — N390 Urinary tract infection, site not specified: Secondary | ICD-10-CM

## 2014-04-07 LAB — POCT URINALYSIS DIPSTICK
Bilirubin, UA: NEGATIVE
Glucose, UA: NEGATIVE
Ketones, UA: NEGATIVE
Leukocytes, UA: NEGATIVE
Nitrite, UA: NEGATIVE
Protein, UA: NEGATIVE
Urobilinogen, UA: NEGATIVE
pH, UA: 5

## 2014-04-07 MED ORDER — NITROFURANTOIN MONOHYD MACRO 100 MG PO CAPS: 100.0000 mg | ORAL_CAPSULE | Freq: Two times a day (BID) | ORAL | Status: DC

## 2014-04-07 NOTE — Patient Instructions (Addendum)

## 2014-04-07 NOTE — Progress Notes (Signed)
Patient ID: Alexandria Mack, female   DOB: 03/07/1961, 53 y.o.   MRN: [id number] 53 y.o. G3P3 Married African American Fe here for annual exam.  Caring for mother in law who has Alzheimer's and needs care 24/7.  She is having some lower pelvic pressure and pain with her bladder for about 2 weeks.  Some urinary urgency and stress incontinence.  Has been doing Kegel exercises with help.  She would like to be checked for STD's even though she does not believe husband has been unfaithful.   Also more problems with constipation and hemorrhoids.  No LMP recorded. Patient is postmenopausal.          Sexually active: no  The current method of family planning is post menopausal status.  Exercising: yes, stretching and a little walking  Smoker: no   Health Maintenance:  Pap: 04/05/13, WNL, neg HR HPV (no history of abnormal)  MMG: 1/ 2015 normal per patient Colonoscopy: 2004 will schedule repeat with Dr. Collene Mares BMD: 2014  TDaP: 2011  Labs: PCP maintains all labs  Urine:  Trace RBC    reports that she has never smoked. She has never used smokeless tobacco. She reports that she drinks alcohol. She reports that she uses illicit drugs (Marijuana).  Past Medical History  Diagnosis Date  . Fibromyalgia 2010  . Chronic pain syndrome   . OSA (obstructive sleep apnea) 2013    no C Pap  . Morbid obesity   . Diverticulitis   . Anxiety 2012    managed with medications   . Depression 2012  . Fibroid 2010    uterine fibroids  . Hypertension 2004  . External hemorrhoid   . Hemorrhoids, internal     Past Surgical History  Procedure Laterality Date  . Colonoscopy  2004  . Vaginal delivery      x3  . Appendectomy  age 19  . Tubal ligation Bilateral 1996    Current Outpatient Prescriptions  Medication Sig Dispense Refill  . BYSTOLIC 10 MG tablet Take 1 tablet by mouth daily.      . Calcium-Magnesium-Vitamin D (CALCIUM MAGNESIUM PO) Take 1 tablet by mouth daily.      Marland Kitchen HYDROcodone-acetaminophen  (NORCO) 5-325 MG per tablet Take 1 tablet by mouth every 8 (eight) hours as needed for pain.       . Lactobacillus (ACIDOPHILUS) CAPS capsule Take 1 capsule by mouth daily.      Marland Kitchen LORazepam (ATIVAN) 0.5 MG tablet Take 0.5 mg by mouth 2 (two) times daily as needed for anxiety.       Marland Kitchen losartan (COZAAR) 100 MG tablet Take 100 mg by mouth daily as needed (for blodd pressure).       Marland Kitchen omeprazole (PRILOSEC) 20 MG capsule Take 20 mg by mouth daily as needed (for acid reflux).      Marland Kitchen OVER THE COUNTER MEDICATION Take 1 capsule by mouth daily. Green Leaf Extract  capsule      . triamterene-hydrochlorothiazide (MAXZIDE-25) 37.5-25 MG per tablet Take 1 tablet by mouth daily.      . nitrofurantoin, macrocrystal-monohydrate, (MACROBID) 100 MG capsule Take 1 capsule (100 mg total) by mouth 2 (two) times daily.  14 capsule  0   No current facility-administered medications for this visit.    Family History  Problem Relation Age of Onset  . Hypertension Father   . Alzheimer's disease Father   . Heart disease Father   . Diabetes Father   . Mental illness Mother   .  Diabetes Mother   . Breast cancer Sister 56    chemo, mastectomy on left  . Diabetes Sister     ROS:  Pertinent items are noted in HPI.  Otherwise, a comprehensive ROS was negative.  Exam:   BP 108/80  Pulse 60  Ht 4\' 11"  (1.499 m)  Wt 206 lb (93.441 kg)  BMI 41.58 kg/m2 Height: 4\' 11"  (149.9 cm)  Ht Readings from Last 3 Encounters:  04/07/14 4\' 11"  (1.499 m)  05/24/13 5' (1.524 m)  04/05/13 4\' 11"  (1.499 m)    General appearance: alert, cooperative and appears stated age Head: Normocephalic, without obvious abnormality, atraumatic Neck: no adenopathy, supple, symmetrical, trachea midline and thyroid normal to inspection and palpation Lungs: clear to auscultation bilaterally Breasts: normal appearance, no masses or tenderness, heat rash under the breast L>R Heart: regular rate and rhythm Abdomen: soft, non-tender; no masses,  no  organomegaly Extremities: extremities normal, atraumatic, no cyanosis or edema Skin: Skin color, texture, turgor normal. No rashes or lesions Lymph nodes: Cervical, supraclavicular, and axillary nodes normal. No abnormal inguinal nodes palpated Neurologic: Grossly normal   Pelvic: External genitalia:  no lesions              Urethra:  normal appearing urethra with no masses, tenderness or lesions              Bartholin's and Skene's: normal                 Vagina: normal appearing vagina with normal color and discharge, no lesions              Cervix: anteverted              Pap taken: No. Bimanual Exam:  Uterus:  enlarged, 6 weeks size              Adnexa: no mass, fullness, tenderness               Rectovaginal: Confirms               Anus:  normal sphincter tone, no lesions, hemorrhoids present  A:  Well Woman with normal exam  Postmenopausal  History of fibromyalgia  History of uterine fibroids - stable - last PUS 04/2013  R/O STD's  R/O UTI  Situational stressors- caring for mother in law  P:   Reviewed health and wellness pertinent to exam  Pap smear not taken today  Will start on Macrobid and follow with urine C&S and micro  Will follow with other STD labs  Counseled on breast self exam, mammography screening, adequate intake of calcium and vitamin D, diet and exercise, Kegel's exercises return annually or prn  An After Visit Summary was printed and given to the patient.

## 2014-04-08 LAB — STD PANEL
HIV 1&2 Ab, 4th Generation: NONREACTIVE
Hepatitis B Surface Ag: NEGATIVE

## 2014-04-08 LAB — URINE CULTURE
Colony Count: NO GROWTH
Organism ID, Bacteria: NO GROWTH

## 2014-04-08 LAB — URINALYSIS, MICROSCOPIC ONLY
Casts: NONE SEEN
Crystals: NONE SEEN

## 2014-04-10 LAB — IPS N GONORRHOEA AND CHLAMYDIA BY PCR

## 2014-04-13 NOTE — Progress Notes (Signed)
Encounter reviewed by Dr. Brook Silva.  

## 2014-05-29 ENCOUNTER — Encounter (HOSPITAL_COMMUNITY): Payer: Self-pay | Admitting: Emergency Medicine

## 2014-05-29 ENCOUNTER — Emergency Department (HOSPITAL_COMMUNITY)
Admission: EM | Admit: 2014-05-29 | Discharge: 2014-05-29 | Disposition: A | Discharge status: Home / Self Care | Payer: BC Managed Care – PPO | Attending: Emergency Medicine | Admitting: Emergency Medicine

## 2014-05-29 DIAGNOSIS — J029 Acute pharyngitis, unspecified: Secondary | ICD-10-CM | POA: Diagnosis not present

## 2014-05-29 DIAGNOSIS — I10 Essential (primary) hypertension: Secondary | ICD-10-CM | POA: Diagnosis not present

## 2014-05-29 DIAGNOSIS — H9203 Otalgia, bilateral: Secondary | ICD-10-CM

## 2014-05-29 DIAGNOSIS — G894 Chronic pain syndrome: Secondary | ICD-10-CM | POA: Diagnosis not present

## 2014-05-29 DIAGNOSIS — R42 Dizziness and giddiness: Secondary | ICD-10-CM | POA: Diagnosis not present

## 2014-05-29 DIAGNOSIS — Z8739 Personal history of other diseases of the musculoskeletal system and connective tissue: Secondary | ICD-10-CM | POA: Diagnosis not present

## 2014-05-29 DIAGNOSIS — Z8742 Personal history of other diseases of the female genital tract: Secondary | ICD-10-CM | POA: Insufficient documentation

## 2014-05-29 DIAGNOSIS — Z79899 Other long term (current) drug therapy: Secondary | ICD-10-CM | POA: Insufficient documentation

## 2014-05-29 DIAGNOSIS — H9209 Otalgia, unspecified ear: Secondary | ICD-10-CM | POA: Diagnosis present

## 2014-05-29 MED ORDER — CETIRIZINE HCL 10 MG PO TABS
10.0000 mg | ORAL_TABLET | Freq: Every day | ORAL | Status: DC
Start: 2014-05-29 — End: 2015-04-10

## 2014-05-29 MED ORDER — AMOXICILLIN 500 MG PO CAPS: 500.0000 mg | ORAL_CAPSULE | Freq: Three times a day (TID) | ORAL | Status: DC

## 2014-05-29 NOTE — ED Provider Notes (Signed)
Medical screening examination/treatment/procedure(s) were performed by non-physician practitioner and as supervising physician I was immediately available for consultation/collaboration.   EKG Interpretation   Date/Time:  Thursday May 29 2014 13:00:17 EDT Ventricular Rate:  48 PR Interval:  140 QRS Duration: 82 QT Interval:  472 QTC Calculation: 422 R Axis:   63 Text Interpretation:  Sinus bradycardia Confirmed by Alicha Raspberry,  DO, Leander Tout  ([id number]) on 05/29/2014 1:30:11 PM        [place], DO 05/29/14 1436

## 2014-05-29 NOTE — ED Notes (Signed)
Pt ambulated in hallway. Pt c/o increased dizziness and light-headedness as walking continued. Pt also c/o of nausea as dizziness increased.

## 2014-05-29 NOTE — Discharge Instructions (Signed)
Dizziness Dizziness is a common problem. It is a feeling of unsteadiness or light-headedness. You may feel like you are about to faint. Dizziness can lead to injury if you stumble or fall. A person of any age group can suffer from dizziness, but dizziness is more common in older adults. CAUSES  Dizziness can be caused by many different things, including:  Middle ear problems.  Standing for too long.  Infections.  An allergic reaction.  Aging.  An emotional response to something, such as the sight of blood.  Side effects of medicines.  Tiredness.  Problems with circulation or blood pressure.  Excessive use of alcohol or medicines, or illegal drug use.  Breathing too fast (hyperventilation).  An irregular heart rhythm (arrhythmia).  A low red blood cell count (anemia).  Pregnancy.  Vomiting, diarrhea, fever, or other illnesses that cause body fluid loss (dehydration).  Diseases or conditions such as Parkinson's disease, high blood pressure (hypertension), diabetes, and thyroid problems.  Exposure to extreme heat. DIAGNOSIS  Your health care provider will ask about your symptoms, perform a physical exam, and perform an electrocardiogram (ECG) to record the electrical activity of your heart. Your health care provider may also perform other heart or blood tests to determine the cause of your dizziness. These may include:  Transthoracic echocardiogram (TTE). During echocardiography, sound waves are used to evaluate how blood flows through your heart.  Transesophageal echocardiogram (TEE).  Cardiac monitoring. This allows your health care provider to monitor your heart rate and rhythm in real time.  Holter monitor. This is a portable device that records your heartbeat and can help diagnose heart arrhythmias. It allows your health care provider to track your heart activity for several days if needed.  Stress tests by exercise or by giving medicine that makes the heart beat  faster. TREATMENT  Treatment of dizziness depends on the cause of your symptoms and can vary greatly. HOME CARE INSTRUCTIONS   Drink enough fluids to keep your urine clear or pale yellow. This is especially important in very hot weather. In older adults, it is also important in cold weather.  Take your medicine exactly as directed if your dizziness is caused by medicines. When taking blood pressure medicines, it is especially important to get up slowly.  Rise slowly from chairs and steady yourself until you feel okay.  In the morning, first sit up on the side of the bed. When you feel okay, stand slowly while holding onto something until you know your balance is fine.  Move your legs often if you need to stand in one place for a long time. Tighten and relax your muscles in your legs while standing.  Have someone stay with you for 1-2 days if dizziness continues to be a problem. Do this until you feel you are well enough to stay alone. Have the person call your health care provider if he or she notices changes in you that are concerning.  Do not drive or use heavy machinery if you feel dizzy.  Do not drink alcohol. SEEK IMMEDIATE MEDICAL CARE IF:   Your dizziness or light-headedness gets worse.  You feel nauseous or vomit.  You have problems talking, walking, or using your arms, hands, or legs.  You feel weak.  You are not thinking clearly or you have trouble forming sentences. It may take a friend or family member to notice this.  You have chest pain, abdominal pain, shortness of breath, or sweating.  Your vision changes.  You notice  any bleeding.  You have side effects from medicine that seems to be getting worse rather than better. MAKE SURE YOU:   Understand these instructions.  Will watch your condition.  Will get help right away if you are not doing well or get worse. Document Released: 01/25/2001 Document Revised: 08/06/2013 Document Reviewed: 02/18/2011 [facility]  Patient Information 2015 [place], [place]. This information is not intended to replace advice given to you by your health care provider. Make sure you discuss any questions you have with your health care provider. Otitis Media With Effusion Otitis media with effusion is the presence of fluid in the middle ear. This is a common problem in children, which often follows ear infections. It may be present for weeks or longer after the infection. Unlike an acute ear infection, otitis media with effusion refers only to fluid behind the ear drum and not infection. Children with repeated ear and sinus infections and allergy problems are the most likely to get otitis media with effusion. CAUSES  The most frequent cause of the fluid buildup is dysfunction of the eustachian tubes. These are the tubes that drain fluid in the ears to the back of the nose (nasopharynx). SYMPTOMS   The main symptom of this condition is hearing loss. As a result, you or your child may:  Listen to the TV at a loud volume.  Not respond to questions.  Ask "what" often when spoken to.  Mistake or confuse one sound or word for another.  There may be a sensation of fullness or pressure but usually not pain. DIAGNOSIS   Your health care provider will diagnose this condition by examining you or your child's ears.  Your health care provider may test the pressure in you or your child's ear with a tympanometer.  A hearing test may be conducted if the problem persists. TREATMENT   Treatment depends on the duration and the effects of the effusion.  Antibiotics, decongestants, nose drops, and cortisone-type drugs (tablets or nasal spray) may not be helpful.  Children with persistent ear effusions may have delayed language or behavioral problems. Children at risk for developmental delays in hearing, learning, and speech may require referral to a specialist earlier than children not at risk.  You or your child's health care provider  may suggest a referral to an ear, nose, and throat surgeon for treatment. The following may help restore normal hearing:  Drainage of fluid.  Placement of ear tubes (tympanostomy tubes).  Removal of adenoids (adenoidectomy). HOME CARE INSTRUCTIONS   Avoid secondhand smoke.  Infants who are breastfed are less likely to have this condition.  Avoid feeding infants while they are lying flat.  Avoid known environmental allergens.  Avoid people who are sick. SEEK MEDICAL CARE IF:   Hearing is not better in 3 months.  Hearing is worse.  Ear pain.  Drainage from the ear.  Dizziness. MAKE SURE YOU:   Understand these instructions.  Will watch your condition.  Will get help right away if you are not doing well or get worse. Document Released: 09/08/2004 Document Revised: 12/16/2013 Document Reviewed: 02/26/2013 [facility] Patient Information 2015 [place], [place]. This information is not intended to replace advice given to you by your health care provider. Make sure you discuss any questions you have with your health care provider.

## 2014-05-29 NOTE — ED Provider Notes (Signed)
CSN: [id number]     Arrival date & time 05/29/14  1056 History   First MD Initiated Contact with Patient 05/29/14 1131     Chief Complaint  Patient presents with  . Otalgia     (Consider location/radiation/quality/duration/timing/severity/associated sxs/prior Treatment) HPI Comments: Patient presents to the emergency department with chief complaint of earache and ear fullness. She reports intermittent episodes of associated lightheadedness/dizziness. She denies any room spinning sensation. She denies any fevers or chills. She states that she was seen by her PCP on Monday for the same, and was given ear drops for otitis externa. She states that the symptoms have not improved. She reports worsening sinus congestion, as well as sore throat, and mild nonproductive cough. There no aggravating or alleviating factors.  The history is provided by the patient. No language interpreter was used.    Past Medical History  Diagnosis Date  . Fibromyalgia 2010  . Chronic pain syndrome   . OSA (obstructive sleep apnea) 2013    no C Pap  . Morbid obesity   . Diverticulitis   . Anxiety 2012    managed with medications   . Depression 2012  . Fibroid 2010    uterine fibroids  . Hypertension 2004  . External hemorrhoid   . Hemorrhoids, internal    Past Surgical History  Procedure Laterality Date  . Colonoscopy  2004  . Vaginal delivery      x3  . Appendectomy  age 68  . Tubal ligation Bilateral 1996   Family History  Problem Relation Age of Onset  . Hypertension Father   . Alzheimer's disease Father   . Heart disease Father   . Diabetes Father   . Mental illness Mother   . Diabetes Mother   . Breast cancer Sister 68    chemo, mastectomy on left  . Diabetes Sister    History  Substance Use Topics  . Smoking status: Never Smoker   . Smokeless tobacco: Never Used  . Alcohol Use: Yes     Comment: occasional   OB History   Grav Para Term Preterm Abortions TAB SAB Ect Mult Living   3 3        3      Review of Systems  Constitutional: Negative for fever and chills.  HENT: Positive for congestion, ear pain and sore throat.   Respiratory: Positive for cough. Negative for shortness of breath.   Cardiovascular: Negative for chest pain.  Gastrointestinal: Negative for nausea, vomiting, diarrhea and constipation.  Genitourinary: Negative for dysuria.  All other systems reviewed and are negative.     Allergies  Codeine  Home Medications   Prior to Admission medications   Medication Sig Start Date End Date Taking? Authorizing Provider  BYSTOLIC 10 MG tablet Take 1 tablet by mouth daily. 04/02/14   Historical Provider, MD  Calcium-Magnesium-Vitamin D (CALCIUM MAGNESIUM PO) Take 1 tablet by mouth daily.    Historical Provider, MD  HYDROcodone-acetaminophen (NORCO) 5-325 MG per tablet Take 1 tablet by mouth every 8 (eight) hours as needed for pain.     Historical Provider, MD  Lactobacillus (ACIDOPHILUS) CAPS capsule Take 1 capsule by mouth daily.    Historical Provider, MD  LORazepam (ATIVAN) 0.5 MG tablet Take 0.5 mg by mouth 2 (two) times daily as needed for anxiety.     Historical Provider, MD  losartan (COZAAR) 100 MG tablet Take 100 mg by mouth daily as needed (for blodd pressure).  04/16/13   Historical Provider, MD  nitrofurantoin,  macrocrystal-monohydrate, (MACROBID) 100 MG capsule Take 1 capsule (100 mg total) by mouth 2 (two) times daily. 04/07/14   Milford Cage, FNP  omeprazole (PRILOSEC) 20 MG capsule Take 20 mg by mouth daily as needed (for acid reflux).    Historical Provider, MD  OVER THE COUNTER MEDICATION Take 1 capsule by mouth daily. Green Leaf Extract  capsule    Historical Provider, MD  triamterene-hydrochlorothiazide (MAXZIDE-25) 37.5-25 MG per tablet Take 1 tablet by mouth daily. 04/02/14   Historical Provider, MD   BP 160/84  Pulse 57  Temp(Src) 98.7 F (37.1 C) (Oral)  Resp 20  Ht 5' (1.524 m)  Wt 204 lb (92.534 kg)  BMI 39.84 kg/m2   SpO2 98% Physical Exam  Nursing note and vitals reviewed. Constitutional: She is oriented to person, place, and time. She appears well-developed and well-nourished.  HENT:  Head: Normocephalic and atraumatic.  Right Ear: External ear normal.  Left Ear: External ear normal.  Mouth/Throat: Oropharynx is clear and moist. No oropharyngeal exudate.  Swollen, erythematous turbinates, maxillary sinuses tender to palpation, bilateral tympanic membranes are mildly erythematous, with congestion, ear canals are clear  Eyes: Conjunctivae and EOM are normal. Pupils are equal, round, and reactive to light.  Neck: Normal range of motion. Neck supple.  Cardiovascular: Normal rate, regular rhythm and normal heart sounds.   Pulmonary/Chest: Effort normal and breath sounds normal. No respiratory distress. She has no wheezes. She has no rales. She exhibits no tenderness.  Abdominal: Soft. She exhibits no distension. There is no tenderness.  Musculoskeletal: Normal range of motion.  Neurological: She is alert and oriented to person, place, and time.  CN 3-12 intact, speech is clear, movements are goal oriented, ambulates  Skin: Skin is warm and dry.  Psychiatric: She has a normal mood and affect. Her behavior is normal. Judgment and thought content normal.    ED Course  Procedures (including critical care time) Labs Review Labs Reviewed - No data to display  Imaging Review No results found.   EKG Interpretation   Date/Time:  Thursday May 29 2014 13:00:17 EDT Ventricular Rate:  48 PR Interval:  140 QRS Duration: 82 QT Interval:  472 QTC Calculation: 422 R Axis:   63 Text Interpretation:  Sinus bradycardia Confirmed by WARD,  DO, KRISTEN  ([id number]) on 05/29/2014 1:30:11 PM      MDM   Final diagnoses:  Otalgia, bilateral  Dizziness   Patient with sinus congestion, earache, and intermittent lightheadedness/dizziness. Suspect the symptoms are secondary to ear fullness/congestion, could be  related to hypertension. Patient has not taken her blood pressure medicine today. I recommend restarting her medications, and will start patient on amoxicillin. She is nontoxic-appearing. She ambulates appropriately. She is neurovascularly intact.  1:03 PM Patient discussed with Dr. Leonides Schanz.  1:33 PM Patient noted to be bradycardic.  Likely related to bystolic, which the patient started recently.  However, the patient states that the dizziness did not start until the ear fulness and sinus congestion, so I think that this is not symptomatic bradycardia, but I have recommend cardiology follow-up if not better after treatment of sinus/otitis.  Montine Circle, PA-C 05/29/14 1339

## 2014-05-29 NOTE — ED Notes (Signed)
Patient states she went to PCP on Monday for ear pain.  Patient states he gave her drops for ears there.  Patient states that her balance has been off since her ears have hurting.   Patient states bilateral ear pain with hearing loss in R ear.   Patient states now having symptoms of sore throat.

## 2014-06-16 ENCOUNTER — Encounter (HOSPITAL_COMMUNITY): Payer: Self-pay | Admitting: Emergency Medicine

## 2014-06-26 ENCOUNTER — Other Ambulatory Visit: Payer: Self-pay | Admitting: Otolaryngology

## 2014-06-27 ENCOUNTER — Other Ambulatory Visit: Payer: Self-pay | Admitting: Otolaryngology

## 2014-06-27 DIAGNOSIS — H9121 Sudden idiopathic hearing loss, right ear: Secondary | ICD-10-CM

## 2014-07-06 ENCOUNTER — Other Ambulatory Visit: Payer: BC Managed Care – PPO

## 2014-07-12 ENCOUNTER — Ambulatory Visit
Admission: RE | Admit: 2014-07-12 | Discharge: 2014-07-12 | Disposition: A | Discharge status: Home / Self Care | Payer: BC Managed Care – PPO | Source: Ambulatory Visit | Attending: Otolaryngology | Admitting: Otolaryngology

## 2014-07-12 ENCOUNTER — Other Ambulatory Visit: Payer: Self-pay | Admitting: Otolaryngology

## 2014-07-12 DIAGNOSIS — H9121 Sudden idiopathic hearing loss, right ear: Secondary | ICD-10-CM

## 2014-12-26 ENCOUNTER — Other Ambulatory Visit: Payer: Self-pay

## 2014-12-26 DIAGNOSIS — Z1231 Encounter for screening mammogram for malignant neoplasm of breast: Secondary | ICD-10-CM

## 2014-12-29 ENCOUNTER — Other Ambulatory Visit: Payer: Self-pay | Admitting: Internal Medicine

## 2014-12-29 DIAGNOSIS — E559 Vitamin D deficiency, unspecified: Secondary | ICD-10-CM

## 2014-12-31 ENCOUNTER — Ambulatory Visit: Payer: Self-pay

## 2015-04-10 ENCOUNTER — Ambulatory Visit (INDEPENDENT_AMBULATORY_CARE_PROVIDER_SITE_OTHER): Payer: BLUE CROSS/BLUE SHIELD | Admitting: Nurse Practitioner

## 2015-04-10 ENCOUNTER — Encounter: Payer: Self-pay | Admitting: Nurse Practitioner

## 2015-04-10 VITALS — BP 122/80 | HR 60 | Ht 59.0 in | Wt 200.0 lb

## 2015-04-10 DIAGNOSIS — Z01419 Encounter for gynecological examination (general) (routine) without abnormal findings: Secondary | ICD-10-CM

## 2015-04-10 DIAGNOSIS — I10 Essential (primary) hypertension: Secondary | ICD-10-CM

## 2015-04-10 DIAGNOSIS — D259 Leiomyoma of uterus, unspecified: Secondary | ICD-10-CM | POA: Diagnosis not present

## 2015-04-10 NOTE — Patient Instructions (Signed)

## 2015-04-10 NOTE — Progress Notes (Signed)
Patient ID: Alexandria Mack, female   DOB: [birth year], 54 y.o.   MRN: [id number] 54 y.o. G3P3 Married African American Fe here for annual exam.  Occasional vaso symptoms.  No insomnia. Husband is not very attentive to her needs - she is now sleeping in a different bedroom. New partner X 1 last pm, used condoms.  Patient's last menstrual period was 08/16/2007 (approximate).          Sexually active: Yes.    The current method of family planning is post menopausal status.    Exercising: Yes.    walking, weights and eliptical at gym 3-5 times per week Smoker:  no  Health Maintenance: Pap: 04/05/13, Negative with neg HR HPV (no history of abnormal)  MMG:  12/2014, [facility], normal per patient Colonoscopy: 2004, scheduled for 04/15/15 with Dr. Collene Mares BMD: 2016 at [facility]: 2011  Labs: PCP maintains all labs   reports that she has never smoked. She has never used smokeless tobacco. She reports that she drinks alcohol. She reports that she uses illicit drugs (Marijuana).  Past Medical History  Diagnosis Date  . Fibromyalgia 2010  . Chronic pain syndrome   . OSA (obstructive sleep apnea) 2013    no C Pap  . Morbid obesity   . Diverticulitis   . Anxiety 2012    managed with medications   . Depression 2012  . Fibroid 2010    uterine fibroids  . Hypertension 2004  . External hemorrhoid   . Hemorrhoids, internal     Past Surgical History  Procedure Laterality Date  . Colonoscopy  2004  . Vaginal delivery      x3  . Appendectomy  age 22  . Tubal ligation Bilateral 1996    Current Outpatient Prescriptions  Medication Sig Dispense Refill  . Cholecalciferol (VITAMIN D3) 5000 UNITS CAPS Take 1 capsule by mouth daily.    Marland Kitchen HYDROcodone-acetaminophen (NORCO) 5-325 MG per tablet Take 1 tablet by mouth every 8 (eight) hours as needed for pain.     Marland Kitchen LORazepam (ATIVAN) 0.5 MG tablet Take 0.5 mg by mouth 2 (two) times daily as needed for anxiety.     . meclizine (ANTIVERT) 12.5 MG tablet Take  12.5 mg by mouth 3 (three) times daily as needed for dizziness.    . naproxen sodium (ANAPROX) 220 MG tablet Take 220-400 mg by mouth 2 (two) times daily as needed (for pain).     . nebivolol (BYSTOLIC) 10 MG tablet Take 10 mg by mouth daily.    Marland Kitchen omeprazole (PRILOSEC) 20 MG capsule Take 20 mg by mouth daily as needed (for acid reflux).     No current facility-administered medications for this visit.    Family History  Problem Relation Age of Onset  . Hypertension Father   . Alzheimer's disease Father   . Heart disease Father   . Diabetes Father   . Mental illness Mother   . Diabetes Mother   . Breast cancer Sister 52    chemo, mastectomy on left  . Diabetes Sister     ROS:  Pertinent items are noted in HPI.  Otherwise, a comprehensive ROS was negative.  Exam:   BP 122/80 mmHg  Pulse 60  Ht 4\' 11"  (1.499 m)  Wt 200 lb (90.719 kg)  BMI 40.37 kg/m2  LMP 08/16/2007 (Approximate) Height: 4\' 11"  (149.9 cm) Ht Readings from Last 3 Encounters:  04/10/15 4\' 11"  (1.499 m)  05/29/14 5' (1.524 m)  04/07/14 4\' 11"  (1.499 m)  General appearance: alert, cooperative and appears stated age Head: Normocephalic, without obvious abnormality, atraumatic Neck: no adenopathy, supple, symmetrical, trachea midline and thyroid normal to inspection and palpation Lungs: clear to auscultation bilaterally Breasts: normal appearance, no masses or tenderness Heart: regular rate and rhythm Abdomen: soft, non-tender; no masses,  no organomegaly Extremities: extremities normal, atraumatic, no cyanosis or edema Skin: Skin color, texture, turgor normal. No rashes or lesions Lymph nodes: Cervical, supraclavicular, and axillary nodes normal. No abnormal inguinal nodes palpated Neurologic: Grossly normal   Pelvic: External genitalia:  no lesions              Urethra:  normal appearing urethra with no masses, tenderness or lesions              Bartholin's and Skene's: normal                 Vagina:  normal appearing vagina with normal color and discharge, no lesions              Cervix: anteverted              Pap taken: No. Bimanual Exam:  Uterus:  normal size, contour, position, consistency, mobility, non-tender              Adnexa: no mass, fullness, tenderness               Rectovaginal: pt. declines               Anus:  Deferred secondary to hemorrhoid flare  Chaperone present: no  A:  Well Woman with normal exam  Postmenopausal History of fibromyalgia History of uterine fibroids - stable - last PUS 04/2013  Situational stressors- caring for mother   HTN   Declines STD's  P:   Reviewed health and wellness pertinent to exam  Pap smear as above  Mammogram is due 12/2015  Counseled on breast self exam, mammography screening, adequate intake of calcium and vitamin D, diet and exercise return annually or prn  An After Visit Summary was printed and given to the patient.

## 2015-04-12 NOTE — Progress Notes (Signed)
Encounter reviewed by Dr. Brook Amundson C. Silva.  

## 2016-04-11 ENCOUNTER — Other Ambulatory Visit: Payer: Self-pay | Admitting: Nurse Practitioner

## 2016-04-11 ENCOUNTER — Encounter: Payer: Self-pay | Admitting: Nurse Practitioner

## 2016-04-11 ENCOUNTER — Ambulatory Visit (INDEPENDENT_AMBULATORY_CARE_PROVIDER_SITE_OTHER): Payer: BLUE CROSS/BLUE SHIELD | Admitting: Nurse Practitioner

## 2016-04-11 VITALS — BP 142/86 | HR 60 | Ht 59.25 in | Wt 197.0 lb

## 2016-04-11 DIAGNOSIS — Z01419 Encounter for gynecological examination (general) (routine) without abnormal findings: Secondary | ICD-10-CM | POA: Diagnosis not present

## 2016-04-11 DIAGNOSIS — Z0000 Encounter for general adult medical examination without abnormal findings: Secondary | ICD-10-CM

## 2016-04-11 DIAGNOSIS — R8290 Unspecified abnormal findings in urine: Secondary | ICD-10-CM

## 2016-04-11 LAB — POCT URINALYSIS DIPSTICK
Bilirubin, UA: NEGATIVE
Glucose, UA: NEGATIVE
Ketones, UA: NEGATIVE
Nitrite, UA: NEGATIVE
Protein, UA: NEGATIVE
Urobilinogen, UA: NEGATIVE
pH, UA: 5

## 2016-04-11 MED ORDER — NEBIVOLOL HCL 10 MG PO TABS: 10.0000 mg | ORAL_TABLET | Freq: Every day | ORAL | 0 refills | Status: DC

## 2016-04-11 MED ORDER — ALPRAZOLAM 0.5 MG PO TABS: 0.5000 mg | ORAL_TABLET | Freq: Every evening | ORAL | 0 refills | Status: DC | PRN

## 2016-04-11 NOTE — Patient Instructions (Addendum)

## 2016-04-11 NOTE — Progress Notes (Signed)
55 y.o. G3P3 Married African American Fe here for annual exam.  Was on disability for fibromyalgia.  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  No flare of fibromyalgia.  She does have hemorrhoids. Fracture right foot 09/2015 and treated with cast and boot.  Her last BMD shows osteopenia of spine - will get rest of report.  Same partner and using condoms.  Patient's last menstrual period was 08/16/2007 (approximate).          Sexually active: Yes.    The current method of family planning is post menopausal status.    Exercising: Yes.    toning and eliptical at gym with personal trainer Smoker:  no  Health Maintenance: Pap: 04/05/13, Negative with neg HR HPV (no history of abnormal)  MMG: 01/08/15, [facility] 2: Benign Colonoscopy: 2004 will schedule repeat with Dr. Collene Mares BMD: 01/08/15 T Score, -1.6 Spine (Lowest Score) TDaP: 2011 Shingles: Not indicated due to age Pneumonia: Not indicated due to age HIV: 04/07/14 Hep C: will discuss with PCP Labs: PCP takes care of all labs  Urine: Moderate RBC, Trace Leuk's   reports that she has never smoked. She has never used smokeless tobacco. She reports that she drinks alcohol. She reports that she uses drugs, including Marijuana.  Past Medical History:  Diagnosis Date  . Anxiety 2012   managed with medications   . Chronic pain syndrome   . Depression 2012  . Diverticulitis   . External hemorrhoid   . Fibroid 2010   uterine fibroids  . Fibromyalgia 2010  . Hemorrhoids, internal   . Hypertension 2004  . Morbid obesity   . OSA (obstructive sleep apnea) 2013   no C Pap    Past Surgical History:  Procedure Laterality Date  . APPENDECTOMY  age 45  . COLONOSCOPY  2004  . TUBAL LIGATION Bilateral 1996  . VAGINAL DELIVERY     x3    Current Outpatient Prescriptions  Medication Sig Dispense Refill  . Cholecalciferol (VITAMIN D3) 5000 UNITS CAPS Take 1 capsule by mouth daily.    Marland Kitchen  HYDROcodone-acetaminophen (NORCO) 5-325 MG per tablet Take 1 tablet by mouth every 8 (eight) hours as needed for pain.     Marland Kitchen LORazepam (ATIVAN) 0.5 MG tablet Take 0.5 mg by mouth 2 (two) times daily as needed for anxiety.     . meclizine (ANTIVERT) 12.5 MG tablet Take 12.5 mg by mouth 3 (three) times daily as needed for dizziness.    . naproxen sodium (ANAPROX) 220 MG tablet Take 220-400 mg by mouth 2 (two) times daily as needed (for pain).     . nebivolol (BYSTOLIC) 10 MG tablet Take 10 mg by mouth daily.    Marland Kitchen omeprazole (PRILOSEC) 20 MG capsule Take 20 mg by mouth daily as needed (for acid reflux).     No current facility-administered medications for this visit.     Family History  Problem Relation Age of Onset  . Hypertension Father   . Alzheimer's disease Father   . Heart disease Father   . Diabetes Father   . Mental illness Mother   . Diabetes Mother   . Breast cancer Sister 21    chemo, mastectomy on left  . Diabetes Sister     ROS:  Pertinent items are noted in HPI.  Otherwise, a comprehensive ROS was negative.  Exam:   LMP 08/16/2007 (Approximate)    Ht Readings from Last 3 Encounters:  04/10/15 4\' 11"  (  1.499 m)  05/29/14 5' (1.524 m)  04/07/14 4\' 11"  (1.499 m)    General appearance: alert, cooperative and appears stated age Head: Normocephalic, without obvious abnormality, atraumatic Neck: no adenopathy, supple, symmetrical, trachea midline and thyroid normal to inspection and palpation Lungs: clear to auscultation bilaterally Breasts: normal appearance, no masses or tenderness Heart: regular rate and rhythm Abdomen: soft, non-tender; no masses,  no organomegaly Extremities: extremities normal, atraumatic, no cyanosis or edema Skin: Skin color, texture, turgor normal. No rashes or lesions Lymph nodes: Cervical, supraclavicular, and axillary nodes normal. No abnormal inguinal nodes palpated Neurologic: Grossly normal   Pelvic: External genitalia:  no lesions               Urethra:  normal appearing urethra with no masses, tenderness or lesions              Bartholin's and Skene's: normal                 Vagina: normal appearing vagina with normal color and discharge, no lesions              Cervix: anteverted              Pap taken: Yes.   Bimanual Exam:  Uterus:  normal size, contour, position, consistency, mobility, non-tender              Adnexa: no mass, fullness, tenderness               Rectovaginal: Confirms               Anus:  normal sphincter tone, no lesions  Chaperone present: yes  A:  Well Woman with normal exam             Postmenopausal History of fibromyalgia History of uterine fibroids - stable - last PUS 04/2013             Situational stressors- caring for mother and marital issues              HTN - currently off med's until new apt.  Hematuria - R/O UTI                        P:   Reviewed health and wellness pertinent to exam  Pap smear is done along with GC/Chl only  Mammogram is due now and will schedule  Since BP is elevated today will refill both HTN med's and Xanax but only for 1 month - no refills at our office for either of these.  Follow with urine micro and C&S  Will plan to repeat BMD in a year  Counseled on breast self exam, mammography screening, STD prevention, adequate intake of calcium and vitamin D, diet and exercise, Kegel's exercises return annually or prn  An After Visit Summary was printed and given to the patient.

## 2016-04-12 ENCOUNTER — Telehealth: Payer: Self-pay | Admitting: Nurse Practitioner

## 2016-04-12 LAB — URINALYSIS, MICROSCOPIC ONLY
Casts: NONE SEEN [LPF]
Crystals: NONE SEEN [HPF]
Yeast: NONE SEEN [HPF]

## 2016-04-12 LAB — URINE CULTURE: Organism ID, Bacteria: 10000

## 2016-04-12 NOTE — Telephone Encounter (Signed)
Returned call to patient. Patient states she is very anxious about results. Calling to check on urine results. RN advised patient urine culture results were not back, but it looked like the micro showed some bacteria. Patient states Chong Sicilian had talked to her yesterday about how she could possibly have a bacterial infection, and she was concerned about that, so she was calling to see if Chong Sicilian knew any more. RN advised this message would be sent to [facility] and once she reviewed, our office would be in touch. Patient agreeable. Aware Chong Sicilian is seeing patient's today and a response might not be immediate.   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   Routing to provider for review.

## 2016-04-12 NOTE — Telephone Encounter (Signed)
Patient is calling to get her results °

## 2016-04-13 ENCOUNTER — Other Ambulatory Visit: Payer: Self-pay | Admitting: Nurse Practitioner

## 2016-04-13 MED ORDER — NITROFURANTOIN MONOHYD MACRO 100 MG PO CAPS: 100.0000 mg | ORAL_CAPSULE | Freq: Two times a day (BID) | ORAL | 0 refills | Status: DC

## 2016-04-13 NOTE — Telephone Encounter (Signed)
Pt states she is having a lot of frequency that is more than what she normally does & some pelvic pressure. Please advise.

## 2016-04-13 NOTE — Telephone Encounter (Signed)
Per PG macrobid sent to pharmacy.  Tried to call patient back but no voicemail was set up. Try again.

## 2016-04-13 NOTE — Telephone Encounter (Signed)
I sent her a result note this am - did not have results back at the time she called yesterday.  The GC and Chl is still pending.

## 2016-04-13 NOTE — Telephone Encounter (Signed)
We will start her on Macrobid to cover for UTI.  Urine culture was very low count - but she is having symptoms so will treat.  Order is placed.

## 2016-04-13 NOTE — Progress Notes (Signed)
Reviewed personally.  M. Suzanne Bauer Ausborn, MD.  

## 2016-04-13 NOTE — Telephone Encounter (Signed)
Unable to leave message.  Voice mail not set up.

## 2016-04-14 LAB — IPS N GONORRHOEA AND CHLAMYDIA BY PCR

## 2016-04-14 NOTE — Telephone Encounter (Signed)
Patient notified of results as written by provider. Pt states she is also wanting to know if she could get a note for work. Pt states due to the urinary frequency, she is having to get up at work a lot & go to the restroom & it is putting her in the red at work & she would like for her employer to know why.

## 2016-04-15 LAB — IPS PAP TEST WITH HPV

## 2016-04-15 NOTE — Telephone Encounter (Signed)
I believe patient was wanting a note for work for the couple of days before she started the medication because when I talked to her on the phone, she stated then that she had been having a lot of frequency the past couple of days at work & everytime she kept getting up & going to the restroom & is away from her work then she got points counting against her.

## 2016-04-15 NOTE — Telephone Encounter (Signed)
With the urine culture being essentially negative or if there is a low grade infection - we have already given antibiotics - this should not be the cause of urinary frequency.  She did not have those symptoms when she was here.

## 2016-04-19 NOTE — Telephone Encounter (Signed)
She may have a note that says she was under care for urinary problems.

## 2016-04-28 NOTE — Telephone Encounter (Signed)
Call to patient regarding note needed.  Voicemail has not been set up, unable to leave message.  [phone] (Mobile)

## 2016-05-02 NOTE — Telephone Encounter (Signed)
Call to patient regarding note needed.  Voicemail has not been set up, unable to leave message.  [phone] (Mobile)

## 2016-05-03 NOTE — Telephone Encounter (Signed)
Patient has not returned phone call regarding note.  Please advise next step.

## 2016-05-03 NOTE — Telephone Encounter (Signed)
Closing encounter

## 2016-05-03 NOTE — Telephone Encounter (Signed)
I feel if she needs this she will call back. Would close encounter at this point

## 2016-07-11 ENCOUNTER — Emergency Department (HOSPITAL_COMMUNITY): Payer: BLUE CROSS/BLUE SHIELD

## 2016-07-11 ENCOUNTER — Encounter (HOSPITAL_COMMUNITY): Payer: Self-pay | Admitting: *Deleted

## 2016-07-11 ENCOUNTER — Emergency Department (HOSPITAL_COMMUNITY)
Admission: EM | Admit: 2016-07-11 | Discharge: 2016-07-11 | Disposition: A | Discharge status: Home / Self Care | Payer: BLUE CROSS/BLUE SHIELD | Attending: Emergency Medicine | Admitting: Emergency Medicine

## 2016-07-11 DIAGNOSIS — I10 Essential (primary) hypertension: Secondary | ICD-10-CM | POA: Diagnosis not present

## 2016-07-11 DIAGNOSIS — N200 Calculus of kidney: Secondary | ICD-10-CM

## 2016-07-11 DIAGNOSIS — N201 Calculus of ureter: Secondary | ICD-10-CM | POA: Insufficient documentation

## 2016-07-11 DIAGNOSIS — R109 Unspecified abdominal pain: Secondary | ICD-10-CM | POA: Diagnosis present

## 2016-07-11 LAB — COMPREHENSIVE METABOLIC PANEL
ALT: 17 U/L (ref 14–54)
AST: 23 U/L (ref 15–41)
Albumin: 4.2 g/dL (ref 3.5–5.0)
Alkaline Phosphatase: 89 U/L (ref 38–126)
Anion gap: 10 (ref 5–15)
BUN: 16 mg/dL (ref 6–20)
CO2: 25 mmol/L (ref 22–32)
Calcium: 10.1 mg/dL (ref 8.9–10.3)
Chloride: 106 mmol/L (ref 101–111)
Creatinine, Ser: 0.95 mg/dL (ref 0.44–1.00)
GFR calc Af Amer: >60 mL/min (ref >60)
GFR calc non Af Amer: >60 mL/min (ref >60)
Glucose, Bld: 130 mg/dL — ABNORMAL HIGH (ref 65–99)
Potassium: 4.3 mmol/L (ref 3.5–5.1)
Sodium: 141 mmol/L (ref 135–145)
Total Bilirubin: 0.7 mg/dL (ref 0.3–1.2)
Total Protein: 7.7 g/dL (ref 6.5–8.1)

## 2016-07-11 LAB — CBC WITH DIFFERENTIAL/PLATELET
Basophils Absolute: 0 10*3/uL (ref 0.0–0.1)
Basophils Relative: 0 %
Eosinophils Absolute: 0.1 10*3/uL (ref 0.0–0.7)
Eosinophils Relative: 1 %
HCT: 41 % (ref 36.0–46.0)
Hemoglobin: 13.2 g/dL (ref 12.0–15.0)
Lymphocytes Relative: 45 %
Lymphs Abs: 2.4 10*3/uL (ref 0.7–4.0)
MCH: 26.2 pg (ref 26.0–34.0)
MCHC: 32.2 g/dL (ref 30.0–36.0)
MCV: 81.3 fL (ref 78.0–100.0)
Monocytes Absolute: 0.4 10*3/uL (ref 0.1–1.0)
Monocytes Relative: 8 %
Neutro Abs: 2.5 10*3/uL (ref 1.7–7.7)
Neutrophils Relative %: 46 %
Platelets: 284 10*3/uL (ref 150–400)
RBC: 5.04 MIL/uL (ref 3.87–5.11)
RDW: 13.5 % (ref 11.5–15.5)
WBC: 5.4 10*3/uL (ref 4.0–10.5)

## 2016-07-11 LAB — URINALYSIS, ROUTINE W REFLEX MICROSCOPIC
Bilirubin Urine: NEGATIVE
Glucose, UA: NEGATIVE mg/dL
Ketones, ur: 40 mg/dL — AB
Nitrite: NEGATIVE
Protein, ur: 100 mg/dL — AB
Specific Gravity, Urine: 1.02 (ref 1.005–1.030)
pH: 6.5 (ref 5.0–8.0)

## 2016-07-11 LAB — URINE MICROSCOPIC-ADD ON: Bacteria, UA: NONE SEEN

## 2016-07-11 MED ORDER — ONDANSETRON 4 MG PO TBDP
ORAL_TABLET | ORAL | Status: AC
  Filled 2016-07-11: qty 1

## 2016-07-11 MED ORDER — KETOROLAC TROMETHAMINE 30 MG/ML IJ SOLN
30.0000 mg | Freq: Once | INTRAMUSCULAR | Status: AC
  Administered 2016-07-11: 30 mg via INTRAVENOUS
  Filled 2016-07-11: qty 1

## 2016-07-11 MED ORDER — ONDANSETRON HCL 4 MG/2ML IJ SOLN
4.0000 mg | Freq: Once | INTRAMUSCULAR | Status: AC
  Administered 2016-07-11: 4 mg via INTRAVENOUS
  Filled 2016-07-11: qty 2

## 2016-07-11 MED ORDER — OXYCODONE-ACETAMINOPHEN 5-325 MG PO TABS
1.0000 | ORAL_TABLET | ORAL | Status: DC | PRN
  Administered 2016-07-11: 1 via ORAL

## 2016-07-11 MED ORDER — OXYCODONE-ACETAMINOPHEN 5-325 MG PO TABS: 2.0000 | ORAL_TABLET | ORAL | 0 refills | Status: DC | PRN

## 2016-07-11 MED ORDER — HYDROMORPHONE HCL 2 MG/ML IJ SOLN: 1.0000 mg | INTRAMUSCULAR | Status: DC | PRN

## 2016-07-11 MED ORDER — ONDANSETRON 4 MG PO TBDP
4.0000 mg | ORAL_TABLET | Freq: Once | ORAL | Status: AC
  Administered 2016-07-11: 4 mg via ORAL

## 2016-07-11 MED ORDER — OXYCODONE-ACETAMINOPHEN 5-325 MG PO TABS
2.0000 | ORAL_TABLET | Freq: Once | ORAL | Status: AC
  Administered 2016-07-11: 2 via ORAL
  Filled 2016-07-11: qty 2

## 2016-07-11 MED ORDER — OXYCODONE-ACETAMINOPHEN 5-325 MG PO TABS
ORAL_TABLET | ORAL | Status: AC
  Filled 2016-07-11: qty 1

## 2016-07-11 MED ORDER — SODIUM CHLORIDE 0.9 % IV BOLUS (SEPSIS)
1000.0000 mL | Freq: Once | INTRAVENOUS | Status: AC
  Administered 2016-07-11: 1000 mL via INTRAVENOUS

## 2016-07-11 MED ORDER — ONDANSETRON 4 MG PO TBDP: 4.0000 mg | ORAL_TABLET | Freq: Three times a day (TID) | ORAL | 0 refills | Status: DC | PRN

## 2016-07-11 NOTE — Discharge Instructions (Signed)
Take the prescribed Percocet every 4 hours tonight and tomorrow.  Call the phone number at Dr.Wrenn's office tomorrow morning at 8. Till then you are to have a same day appointment with the on-call urologist per Dr. Jeffie Pollock.  Dr. Jeneen Rinks spoke with Dr. Jeffie Pollock in the emergency room on Monday night regarding this

## 2016-07-11 NOTE — ED Triage Notes (Signed)
Pt states acute onset R lower back pain that radiates to RLQ.  States was fine this am and was able to complete workout.  States constipation (last bm Tues).  Denies urinary changes.  Emesis in triage.

## 2016-07-11 NOTE — ED Notes (Signed)
Got patient into a gown and on the monitor  Waiting for provider

## 2016-07-11 NOTE — ED Notes (Signed)
Pt remains in severe pain after percocet.

## 2016-07-11 NOTE — ED Provider Notes (Addendum)
[facility] DEPT Provider Note   CSN: [id number] Arrival date & time: 07/11/16  1217     History   Chief Complaint Chief Complaint  Patient presents with  . Flank Pain  . Nausea    HPI Alexandria Mack is a 55 y.o. female. She presents for evaluation of flank pain  HPI:  Intermittent normal state of health this morning. She up and went to the gym. She did not do any particular heavy or different workout. Upon returning home she was resting and reading a book she felt rather sudden onset of right flank pain radiating to her right midabdomen. Became nauseated and had some vomiting. Presented here. Given some additional pain medication at triage, had an additional episode of emesis. Does not have leg pain. No midline back pain. No radicular symptoms. No weakness. No history of kidney stones or similar episodes of pain  Past Medical History:  Diagnosis Date  . Anxiety 2012   managed with medications   . Chronic pain syndrome   . Depression 2012  . Diverticulitis   . External hemorrhoid   . Fibroid 2010   uterine fibroids  . Fibromyalgia 2010  . Hemorrhoids, internal   . Hypertension 2004  . Morbid obesity ([place])   . OSA (obstructive sleep apnea) 2013   no C Pap    Patient Active Problem List   Diagnosis Date Noted  . External hemorrhoid   . Hemorrhoids, internal   . Uterine fibroid 04/25/2013    Past Surgical History:  Procedure Laterality Date  . APPENDECTOMY  age 72  . COLONOSCOPY  2004  . TUBAL LIGATION Bilateral 1996  . VAGINAL DELIVERY     x3    OB History    Gravida Para Term Preterm AB Living   3 3       3    SAB TAB Ectopic Multiple Live Births                   Home Medications    Prior to Admission medications   Medication Sig Start Date End Date Taking? Authorizing Provider  DENTA 5000 PLUS 1.1 % CREA dental cream 2 (two) times daily. as directed 02/13/16  Yes Historical Provider, MD  DUEXIS 800-26.6 MG TABS Take 1 tablet by mouth 2 (two)  times daily as needed. For pain 03/10/16  Yes Historical Provider, MD  omeprazole (PRILOSEC) 20 MG capsule Take 20 mg by mouth daily as needed (for acid reflux).   Yes Historical Provider, MD  ALPRAZolam Duanne Moron) 0.5 MG tablet Take 1 tablet (0.5 mg total) by mouth at bedtime as needed for anxiety. Patient not taking: Reported on 07/11/2016 04/11/16   Kem Boroughs, FNP  nebivolol (BYSTOLIC) 10 MG tablet Take 1 tablet (10 mg total) by mouth daily. Patient not taking: Reported on 07/11/2016 04/11/16   Kem Boroughs, FNP  nitrofurantoin, macrocrystal-monohydrate, (MACROBID) 100 MG capsule Take 1 capsule (100 mg total) by mouth 2 (two) times daily. Patient not taking: Reported on 07/11/2016 04/13/16   Kem Boroughs, FNP  ondansetron (ZOFRAN ODT) 4 MG disintegrating tablet Take 1 tablet (4 mg total) by mouth every 8 (eight) hours as needed for nausea. 07/11/16   Tanna Furry, MD  oxyCODONE-acetaminophen (PERCOCET/ROXICET) 5-325 MG tablet Take 2 tablets by mouth every 4 (four) hours as needed. 07/11/16   Tanna Furry, MD    Family History Family History  Problem Relation Age of Onset  . Hypertension Father   . Alzheimer's disease Father   . Heart  disease Father   . Diabetes Father   . Mental illness Mother   . Diabetes Mother   . Breast cancer Sister 43    chemo, mastectomy on left  . Diabetes Sister     Social History Social History  Substance Use Topics  . Smoking status: Never Smoker  . Smokeless tobacco: Never Used  . Alcohol use Yes     Comment: occasional     Allergies   Codeine   Review of Systems Review of Systems  Constitutional: Negative for appetite change, chills, diaphoresis, fatigue and fever.  HENT: Negative for mouth sores, sore throat and trouble swallowing.   Eyes: Negative for visual disturbance.  Respiratory: Negative for cough, chest tightness, shortness of breath and wheezing.   Cardiovascular: Negative for chest pain.  Gastrointestinal: Negative for abdominal  distention, abdominal pain, diarrhea, nausea and vomiting.  Endocrine: Negative for polydipsia, polyphagia and polyuria.  Genitourinary: Positive for flank pain. Negative for dysuria, frequency and hematuria.       Patient states that a few months ago she had her routine OB/GYN visit and had hematuria. Was treated with antibiotic. Has not had follow-up UA since that time  Musculoskeletal: Negative for gait problem.  Skin: Negative for color change, pallor and rash.  Neurological: Negative for dizziness, syncope, light-headedness and headaches.  Hematological: Does not bruise/bleed easily.  Psychiatric/Behavioral: Negative for behavioral problems and confusion.     Physical Exam Updated Vital Signs BP (!) 223/108   Pulse (!) 58   Temp 98.6 F (37 C) (Oral)   Resp 20   Ht 5' (1.524 m)   Wt 190 lb (86.2 kg)   LMP 08/16/2007 (Approximate)   SpO2 100%   BMI 37.11 kg/m   Physical Exam  Constitutional: She is oriented to person, place, and time. She appears well-developed and well-nourished. No distress.  Appears uncomfortable. Mild distress 2/2 pain.  HENT:  Head: Normocephalic.  Eyes: Conjunctivae are normal. Pupils are equal, round, and reactive to light. No scleral icterus.  Neck: Normal range of motion. Neck supple. No thyromegaly present.  Cardiovascular: Normal rate and regular rhythm.  Exam reveals no gallop and no friction rub.   No murmur heard. Pulmonary/Chest: Effort normal and breath sounds normal. No respiratory distress. She has no wheezes. She has no rales.  Abdominal: Soft. Bowel sounds are normal. She exhibits no distension. There is no tenderness. There is no rebound.  C/o pain, but non tender to palpate  Musculoskeletal: Normal range of motion.  Neurological: She is alert and oriented to person, place, and time.  Skin: Skin is warm and dry. No rash noted.  Psychiatric: She has a normal mood and affect. Her behavior is normal.     ED Treatments / Results    Labs (all labs ordered are listed, but only abnormal results are displayed) Labs Reviewed  COMPREHENSIVE METABOLIC PANEL - Abnormal; Notable for the following:       Result Value   Glucose, Bld 130 (*)    All other components within normal limits  URINALYSIS, ROUTINE W REFLEX MICROSCOPIC (NOT AT [facility]) - Abnormal; Notable for the following:    Color, Urine ORANGE (*)    APPearance CLOUDY (*)    Hgb urine dipstick LARGE (*)    Ketones, ur 40 (*)    Protein, ur 100 (*)    Leukocytes, UA TRACE (*)    All other components within normal limits  URINE MICROSCOPIC-ADD ON - Abnormal; Notable for the following:  Squamous Epithelial / LPF 0-5 (*)    All other components within normal limits  CBC WITH DIFFERENTIAL/PLATELET    EKG  EKG Interpretation None       Radiology Ct Renal Stone Study  Result Date: 07/11/2016 CLINICAL DATA:  Right flank pain.  Assess for renal stone disease. EXAM: CT ABDOMEN AND PELVIS WITHOUT CONTRAST TECHNIQUE: Multidetector CT imaging of the abdomen and pelvis was performed following the standard protocol without IV contrast. COMPARISON:  None. FINDINGS: Lower chest: 3 mm subpleural nodule in the right middle lobe image 3 not likely of any significance. No pleural or pericardial fluid. Hepatobiliary: Liver is normal. Gallstones dependent within the gallbladder. No CT evidence of cholecystitis. Pancreas: Normal Spleen: Normal Adrenals/Urinary Tract: Adrenal glands are normal. The left kidney is normal without cyst, mass, stone or hydronephrosis. Right kidney is markedly swollen and there is hydroureteronephrosis secondary to a 6 x 8 mm stone in the ureter at the L4-5 level. No stone distal to that. No stone in the bladder. Stomach/Bowel: Normal Vascular/Lymphatic: Normal Reproductive: Normal Other: No free fluid or air. Musculoskeletal: Disc degeneration at L5-S1. IMPRESSION: Acute severe hydronephrosis on the right because of a 6 x 8 mm stone in the proximal ureter  at the L4-5 level. Chololithiasis without CT evidence of cholecystitis. Electronically Signed   By: Nelson Chimes M.D.   On: 07/11/2016 17:50    Procedures Procedures (including critical care time)  Medications Ordered in ED Medications  oxyCODONE-acetaminophen (PERCOCET/ROXICET) 5-325 MG per tablet 1 tablet (1 tablet Oral Given 07/11/16 1249)  ondansetron (ZOFRAN-ODT) 4 MG disintegrating tablet (not administered)  oxyCODONE-acetaminophen (PERCOCET/ROXICET) 5-325 MG per tablet (not administered)  HYDROmorphone (DILAUDID) injection 1 mg (not administered)  oxyCODONE-acetaminophen (PERCOCET/ROXICET) 5-325 MG per tablet 2 tablet (not administered)  ondansetron (ZOFRAN) injection 4 mg (not administered)  ondansetron (ZOFRAN-ODT) disintegrating tablet 4 mg (4 mg Oral Given 07/11/16 1249)  ondansetron (ZOFRAN) injection 4 mg (4 mg Intravenous Given 07/11/16 1702)  ketorolac (TORADOL) 30 MG/ML injection 30 mg (30 mg Intravenous Given 07/11/16 1702)  sodium chloride 0.9 % bolus 1,000 mL (0 mLs Intravenous Stopped 07/11/16 1900)     Initial Impression / Assessment and Plan / ED Course  I have reviewed the triage vital signs and the nursing notes.  Pertinent labs & imaging results that were available during my care of the patient were reviewed by me and considered in my medical decision making (see chart for details).  Clinical Course     Suspect kidney stone.  Plan IVF and pain meds.  19:00:  Patient asymptomatic. CT findings discussed with the patient. I also discussed the case with Dr. Irine Seal of [facility]. He states the patient can be seen tomorrow in the office by the on-call urologist to discuss possibility of intervention. Patient will be nothing by mouth after midnight. Given Percocet by mouth here. Discharged with prescription, instructions, Dr. Ralene Muskrat office address and phone number. All questions were answered.  Final Clinical Impressions(s) / ED Diagnoses   Final  diagnoses:  Kidney stone    New Prescriptions New Prescriptions   ONDANSETRON (ZOFRAN ODT) 4 MG DISINTEGRATING TABLET    Take 1 tablet (4 mg total) by mouth every 8 (eight) hours as needed for nausea.   OXYCODONE-ACETAMINOPHEN (PERCOCET/ROXICET) 5-325 MG TABLET    Take 2 tablets by mouth every 4 (four) hours as needed.     Tanna Furry, MD 07/11/16 1903    Tanna Furry, MD 07/30/16 [phone]

## 2016-07-11 NOTE — ED Notes (Signed)
Pt is in stable condition upon d/c and is escorted from ED via wheelchair. 

## 2017-02-27 ENCOUNTER — Telehealth: Payer: Self-pay | Admitting: Obstetrics and Gynecology

## 2017-02-27 NOTE — Telephone Encounter (Signed)
Left message regarding upcoming appointment has been canceled and needs to be rescheduled. Patient will call later to reschedule.

## 2017-04-12 ENCOUNTER — Ambulatory Visit: Payer: BLUE CROSS/BLUE SHIELD | Admitting: Nurse Practitioner

## 2018-09-04 ENCOUNTER — Other Ambulatory Visit: Payer: Self-pay

## 2018-09-04 ENCOUNTER — Emergency Department (HOSPITAL_COMMUNITY)
Admission: EM | Admit: 2018-09-04 | Discharge: 2018-09-04 | Disposition: A | Discharge status: Home / Self Care | Payer: Self-pay | Attending: Emergency Medicine | Admitting: Emergency Medicine

## 2018-09-04 ENCOUNTER — Encounter (HOSPITAL_COMMUNITY): Payer: Self-pay | Admitting: Emergency Medicine

## 2018-09-04 ENCOUNTER — Emergency Department (HOSPITAL_COMMUNITY): Payer: Self-pay

## 2018-09-04 DIAGNOSIS — I10 Essential (primary) hypertension: Secondary | ICD-10-CM | POA: Insufficient documentation

## 2018-09-04 DIAGNOSIS — R002 Palpitations: Secondary | ICD-10-CM | POA: Insufficient documentation

## 2018-09-04 LAB — URINALYSIS, ROUTINE W REFLEX MICROSCOPIC
Bilirubin Urine: NEGATIVE
Glucose, UA: NEGATIVE mg/dL
Hgb urine dipstick: NEGATIVE
Ketones, ur: NEGATIVE mg/dL
Leukocytes, UA: NEGATIVE
Nitrite: NEGATIVE
Protein, ur: NEGATIVE mg/dL
Specific Gravity, Urine: 1.012 (ref 1.005–1.030)
pH: 6 (ref 5.0–8.0)

## 2018-09-04 LAB — CBC WITH DIFFERENTIAL/PLATELET
Abs Immature Granulocytes: 0.01 10*3/uL (ref 0.00–0.07)
Basophils Absolute: 0 10*3/uL (ref 0.0–0.1)
Basophils Relative: 1 %
Eosinophils Absolute: 0.1 10*3/uL (ref 0.0–0.5)
Eosinophils Relative: 2 %
HCT: 47.9 % — ABNORMAL HIGH (ref 36.0–46.0)
Hemoglobin: 13.6 g/dL (ref 12.0–15.0)
Immature Granulocytes: 0 %
Lymphocytes Relative: 27 %
Lymphs Abs: 1.2 10*3/uL (ref 0.7–4.0)
MCH: 24.4 pg — ABNORMAL LOW (ref 26.0–34.0)
MCHC: 28.4 g/dL — ABNORMAL LOW (ref 30.0–36.0)
MCV: 85.8 fL (ref 80.0–100.0)
Monocytes Absolute: 0.4 10*3/uL (ref 0.1–1.0)
Monocytes Relative: 8 %
Neutro Abs: 2.7 10*3/uL (ref 1.7–7.7)
Neutrophils Relative %: 62 %
Platelets: 262 10*3/uL (ref 150–400)
RBC: 5.58 MIL/uL — ABNORMAL HIGH (ref 3.87–5.11)
RDW: 13 % (ref 11.5–15.5)
WBC: 4.3 10*3/uL (ref 4.0–10.5)
nRBC: 0 % (ref 0.0–0.2)

## 2018-09-04 LAB — COMPREHENSIVE METABOLIC PANEL
ALT: 18 U/L (ref 0–44)
AST: 20 U/L (ref 15–41)
Albumin: 4.4 g/dL (ref 3.5–5.0)
Alkaline Phosphatase: 82 U/L (ref 38–126)
Anion gap: 12 (ref 5–15)
BUN: 17 mg/dL (ref 6–20)
CO2: 24 mmol/L (ref 22–32)
Calcium: 10 mg/dL (ref 8.9–10.3)
Chloride: 106 mmol/L (ref 98–111)
Creatinine, Ser: 0.78 mg/dL (ref 0.44–1.00)
GFR calc Af Amer: >60 mL/min (ref >60)
GFR calc non Af Amer: >60 mL/min (ref >60)
Glucose, Bld: 94 mg/dL (ref 70–99)
Potassium: 3.7 mmol/L (ref 3.5–5.1)
Sodium: 142 mmol/L (ref 135–145)
Total Bilirubin: 0.8 mg/dL (ref 0.3–1.2)
Total Protein: 8.4 g/dL — ABNORMAL HIGH (ref 6.5–8.1)

## 2018-09-04 LAB — D-DIMER, QUANTITATIVE (NOT AT ARMC): D-Dimer, Quant: 0.27 ug/mL-FEU (ref 0.00–0.50)

## 2018-09-04 LAB — TROPONIN I
Troponin I: <0.03 ng/mL (ref <0.03)
Troponin I: <0.03 ng/mL (ref <0.03)

## 2018-09-04 MED ORDER — SODIUM CHLORIDE 0.9 % IV BOLUS
1000.0000 mL | Freq: Once | INTRAVENOUS | Status: AC
  Administered 2018-09-04: 1000 mL via INTRAVENOUS

## 2018-09-04 MED ORDER — NEBIVOLOL HCL 10 MG PO TABS: 10.0000 mg | ORAL_TABLET | Freq: Every day | ORAL | 0 refills | Status: DC

## 2018-09-04 NOTE — ED Provider Notes (Signed)
6:01 PM handoff from [place] PA-C at shift change.  Patient here with several episodes of tachyarrhythmias with associated lightheadedness and some arm discomfort.  Patient had a short-lived episode yesterday where it felt like "my heart was beating out of my chest".  Today she had an episode of lightheadedness without palpitations.  Work-up in the emergency department today with EKG, blood work, troponin x2, UA largely reassuring.  Patient does admit to not taking her beta-blocker for approximately 1 week, restarting yesterday.  This may have contributed to her symptoms.  No indications for active acute coronary syndrome.  Given cardiomegaly on chest x-ray, feel that patient would benefit from cardiology evaluation.  She has not seen a cardiologist in some time.  She may be candidate for a Holter monitor or echocardiography.  Patient encouraged to return to the emergency department with additional episodes of lightheadedness, chest pain, shortness of breath, if she passes out, develops new symptoms or has other concerns.  She verbalizes understanding and agrees with plan.  BP (!) 178/85   Pulse 100   Resp 16   Ht 5\' 1"  (1.549 m)   Wt 84.8 kg   LMP 08/16/2007 (Approximate)   SpO2 97%   BMI 35.33 kg/m     Carlisle Cater, PA-C 09/04/18 1804    Hayden Rasmussen, MD 09/05/18 [phone]

## 2018-09-04 NOTE — Discharge Instructions (Signed)
Please read and follow all provided instructions.  Your diagnoses today include:  1. Palpitations   2. Essential hypertension     Tests performed today include:  An EKG of your heart  A chest x-ray  Cardiac enzymes - a blood test for heart muscle damage  Blood counts and electrolytes  Vital signs. See below for your results today.   Medications prescribed:   Nebivolol -medication for blood pressure  Take any prescribed medications only as directed.  Follow-up instructions: Please follow-up with the cardiologist listed for further evaluation of your symptoms.  Return instructions:  SEEK IMMEDIATE MEDICAL ATTENTION IF:  You have severe chest pain, especially if the pain is crushing or pressure-like and spreads to the arms, back, neck, or jaw, or if you have sweating, nausea (feeling sick to your stomach), or shortness of breath. THIS IS AN EMERGENCY. Don't wait to see if the pain will go away. Get medical help at once. Call 911 or 0 (operator). DO NOT drive yourself to the hospital.   Your chest pain gets worse and does not go away with rest.   You have an attack of chest pain lasting longer than usual, despite rest and treatment with the medications your caregiver has prescribed.   You wake from sleep with chest pain or shortness of breath.  You feel dizzy or faint.  You have chest pain not typical of your usual pain for which you originally saw your caregiver.   You have any other emergent concerns regarding your health.  Additional Information: Chest pain comes from many different causes. Your caregiver has diagnosed you as having chest pain that is not specific for one problem, but does not require admission.  You are at low risk for an acute heart condition or other serious illness.   Your vital signs today were: BP (!) 178/85    Pulse 100    Resp 16    Ht 5\' 1"  (1.549 m)    Wt 84.8 kg    LMP 08/16/2007 (Approximate)    SpO2 97%    BMI 35.33 kg/m  If your blood  pressure (BP) was elevated above 135/85 this visit, please have this repeated by your doctor within one month. --------------

## 2018-09-04 NOTE — ED Notes (Signed)
Pt aware of need for urine specimen. 

## 2018-09-04 NOTE — ED Triage Notes (Signed)
Pt arrives to ED from home with complaints of palpations yesterday and again today. EMS reports pt had dizzy spells which each episode. Pt denies any CP. Pt placed in position of comfort with bed locked and lowered, call bell in reach.

## 2018-09-04 NOTE — ED Notes (Signed)
ED Provider at bedside. 

## 2018-09-04 NOTE — ED Provider Notes (Signed)
[facility] EMERGENCY DEPARTMENT Provider Note   CSN: [id number] Arrival date & time: 09/04/18  1258     History   Chief Complaint Chief Complaint  Patient presents with  . Palpitations    HPI Alexandria Mack is a 58 y.o. female.  HPI Patient presents to the emergency department with palpitations and what sounds like lightheadedness.  The patient states that she has had a history of vertigo and this does not feel similar.  Patient states yesterday she had an episode where she felt like her heart was racing and palpitations and lightheadedness that lasted for about a minute.  Patient states that this also seemed to occur again today while at work and felt lightheaded like she was going to pass out.  Patient states that nothing seems make the condition better or worse.  The patient denies chest pain, shortness of breath, headache,blurred vision, neck pain, fever, cough, weakness, numbness, dizziness, anorexia, edema, abdominal pain, nausea, vomiting, diarrhea, rash, back pain, dysuria, hematemesis, bloody stool,  or syncope. Past Medical History:  Diagnosis Date  . Anxiety 2012   managed with medications   . Chronic pain syndrome   . Depression 2012  . Diverticulitis   . External hemorrhoid   . Fibroid 2010   uterine fibroids  . Fibromyalgia 2010  . Hemorrhoids, internal   . Hypertension 2004  . Morbid obesity ([place])   . OSA (obstructive sleep apnea) 2013   no C Pap    Patient Active Problem List   Diagnosis Date Noted  . External hemorrhoid   . Hemorrhoids, internal   . Uterine fibroid 04/25/2013    Past Surgical History:  Procedure Laterality Date  . APPENDECTOMY  age 3  . COLONOSCOPY  2004  . TUBAL LIGATION Bilateral 1996  . VAGINAL DELIVERY     x3     OB History    Gravida  3   Para  3   Term      Preterm      AB      Living  3     SAB      TAB      Ectopic      Multiple      Live Births               Home  Medications    Prior to Admission medications   Medication Sig Start Date End Date Taking? Authorizing Provider  ALPRAZolam Duanne Moron) 0.5 MG tablet Take 1 tablet (0.5 mg total) by mouth at bedtime as needed for anxiety. Patient not taking: Reported on 07/11/2016 04/11/16   Kem Boroughs, FNP  DENTA 5000 PLUS 1.1 % CREA dental cream 2 (two) times daily. as directed 02/13/16   [provider]  DUEXIS 800-26.6 MG TABS Take 1 tablet by mouth 2 (two) times daily as needed. For pain 03/10/16   [provider]  nebivolol (BYSTOLIC) 10 MG tablet Take 1 tablet (10 mg total) by mouth daily. Patient not taking: Reported on 07/11/2016 04/11/16   Kem Boroughs, FNP  nitrofurantoin, macrocrystal-monohydrate, (MACROBID) 100 MG capsule Take 1 capsule (100 mg total) by mouth 2 (two) times daily. Patient not taking: Reported on 07/11/2016 04/13/16   Kem Boroughs, FNP  omeprazole (PRILOSEC) 20 MG capsule Take 20 mg by mouth daily as needed (for acid reflux).    [provider]  ondansetron (ZOFRAN ODT) 4 MG disintegrating tablet Take 1 tablet (4 mg total) by mouth every 8 (eight) hours as  needed for nausea. 07/11/16   Tanna Furry, MD  oxyCODONE-acetaminophen (PERCOCET/ROXICET) 5-325 MG tablet Take 2 tablets by mouth every 4 (four) hours as needed. 07/11/16   Tanna Furry, MD    Family History Family History  Problem Relation Age of Onset  . Hypertension Father   . Alzheimer's disease Father   . Heart disease Father   . Diabetes Father   . Mental illness Mother   . Diabetes Mother   . Breast cancer Sister 41       chemo, mastectomy on left  . Diabetes Sister     Social History Social History   Tobacco Use  . Smoking status: Never Smoker  . Smokeless tobacco: Never Used  Substance Use Topics  . Alcohol use: Yes    Comment: occasional  . Drug use: Yes    Types: Marijuana    Comment: tried for treatment of fibromyalgia - stopped secondaryy to burning in mouth      Allergies   Codeine   Review of Systems Review of Systems All other systems negative except as documented in the HPI. All pertinent positives and negatives as reviewed in the HPI.  Physical Exam Updated Vital Signs BP (!) 172/85   Pulse (!) 51   Resp 19   Ht 5\' 1"  (1.549 m)   Wt 84.8 kg   LMP 08/16/2007 (Approximate)   SpO2 99%   BMI 35.33 kg/m   Physical Exam Vitals signs and nursing note reviewed.  Constitutional:      General: She is not in acute distress.    Appearance: She is well-developed.  HENT:     Head: Normocephalic and atraumatic.     Nose: Nose normal.  Eyes:     Pupils: Pupils are equal, round, and reactive to light.  Neck:     Musculoskeletal: Normal range of motion and neck supple.  Cardiovascular:     Rate and Rhythm: Normal rate and regular rhythm.     Heart sounds: Normal heart sounds. No murmur. No friction rub. No gallop.   Pulmonary:     Effort: Pulmonary effort is normal. No respiratory distress.     Breath sounds: Normal breath sounds. No wheezing.  Abdominal:     General: Bowel sounds are normal. There is no distension.     Palpations: Abdomen is soft.     Tenderness: There is no abdominal tenderness.  Skin:    General: Skin is warm and dry.     Capillary Refill: Capillary refill takes less than 2 seconds.     Findings: No erythema or rash.  Neurological:     Mental Status: She is alert and oriented to person, place, and time.     Motor: No abnormal muscle tone.     Coordination: Coordination normal.  Psychiatric:        Behavior: Behavior normal.      ED Treatments / Results  Labs (all labs ordered are listed, but only abnormal results are displayed) Labs Reviewed  COMPREHENSIVE METABOLIC PANEL  CBC WITH DIFFERENTIAL/PLATELET  D-DIMER, QUANTITATIVE (NOT AT [facility])  TROPONIN I  URINALYSIS, ROUTINE W REFLEX MICROSCOPIC    EKG None  Radiology No results found.  Procedures Procedures (including critical care  time)  Medications Ordered in ED Medications - No data to display   Initial Impression / Assessment and Plan / ED Course  I have reviewed the triage vital signs and the nursing notes.  Pertinent labs & imaging results that were available during my care of  the patient were reviewed by me and considered in my medical decision making (see chart for details).     Patient will have laboratory testing here in the emergency department and placed on the monitor.  She will need to troponins.  Her EKG does not show any acute changes at this time.  Patient may need cardiological follow-up if her laboratory testing comes back negative.  Final Clinical Impressions(s) / ED Diagnoses   Final diagnoses:  None    ED Discharge Orders    None       Dalia Heading, Hershal Coria 09/04/18 [place]    Virgel Manifold, MD 09/10/18 1044

## 2018-09-04 NOTE — ED Notes (Signed)
Patient verbalizes understanding of discharge instructions. Opportunity for questioning and answers were provided. Armband removed by staff, pt discharged from ED.  

## 2019-03-22 DIAGNOSIS — Z124 Encounter for screening for malignant neoplasm of cervix: Secondary | ICD-10-CM | POA: Diagnosis not present

## 2019-03-22 DIAGNOSIS — F418 Other specified anxiety disorders: Secondary | ICD-10-CM | POA: Diagnosis not present

## 2019-03-22 DIAGNOSIS — G4733 Obstructive sleep apnea (adult) (pediatric): Secondary | ICD-10-CM | POA: Diagnosis not present

## 2019-03-22 DIAGNOSIS — I10 Essential (primary) hypertension: Secondary | ICD-10-CM | POA: Diagnosis not present

## 2019-06-21 DIAGNOSIS — I10 Essential (primary) hypertension: Secondary | ICD-10-CM | POA: Diagnosis not present

## 2019-06-21 DIAGNOSIS — G4733 Obstructive sleep apnea (adult) (pediatric): Secondary | ICD-10-CM | POA: Diagnosis not present

## 2019-06-21 DIAGNOSIS — F418 Other specified anxiety disorders: Secondary | ICD-10-CM | POA: Diagnosis not present

## 2019-07-18 ENCOUNTER — Emergency Department (HOSPITAL_COMMUNITY): Payer: BLUE CROSS/BLUE SHIELD

## 2019-07-18 ENCOUNTER — Encounter (HOSPITAL_COMMUNITY): Payer: Self-pay | Admitting: Emergency Medicine

## 2019-07-18 ENCOUNTER — Other Ambulatory Visit: Payer: Self-pay

## 2019-07-18 ENCOUNTER — Emergency Department (HOSPITAL_COMMUNITY)
Admission: EM | Admit: 2019-07-18 | Discharge: 2019-07-18 | Disposition: A | Discharge status: Home / Self Care | Payer: BLUE CROSS/BLUE SHIELD | Attending: Emergency Medicine | Admitting: Emergency Medicine

## 2019-07-18 DIAGNOSIS — Z20828 Contact with and (suspected) exposure to other viral communicable diseases: Secondary | ICD-10-CM | POA: Insufficient documentation

## 2019-07-18 DIAGNOSIS — R911 Solitary pulmonary nodule: Secondary | ICD-10-CM | POA: Insufficient documentation

## 2019-07-18 DIAGNOSIS — Z79899 Other long term (current) drug therapy: Secondary | ICD-10-CM | POA: Diagnosis not present

## 2019-07-18 DIAGNOSIS — R079 Chest pain, unspecified: Secondary | ICD-10-CM | POA: Diagnosis not present

## 2019-07-18 DIAGNOSIS — I10 Essential (primary) hypertension: Secondary | ICD-10-CM

## 2019-07-18 DIAGNOSIS — J069 Acute upper respiratory infection, unspecified: Secondary | ICD-10-CM | POA: Insufficient documentation

## 2019-07-18 DIAGNOSIS — R0602 Shortness of breath: Secondary | ICD-10-CM | POA: Diagnosis not present

## 2019-07-18 DIAGNOSIS — R590 Localized enlarged lymph nodes: Secondary | ICD-10-CM | POA: Insufficient documentation

## 2019-07-18 DIAGNOSIS — K8020 Calculus of gallbladder without cholecystitis without obstruction: Secondary | ICD-10-CM | POA: Diagnosis not present

## 2019-07-18 DIAGNOSIS — R072 Precordial pain: Secondary | ICD-10-CM | POA: Diagnosis not present

## 2019-07-18 DIAGNOSIS — I700 Atherosclerosis of aorta: Secondary | ICD-10-CM | POA: Diagnosis not present

## 2019-07-18 LAB — BASIC METABOLIC PANEL
Anion gap: 10 (ref 5–15)
BUN: 12 mg/dL (ref 6–20)
CO2: 26 mmol/L (ref 22–32)
Calcium: 9.2 mg/dL (ref 8.9–10.3)
Chloride: 106 mmol/L (ref 98–111)
Creatinine, Ser: 1.02 mg/dL — ABNORMAL HIGH (ref 0.44–1.00)
GFR calc Af Amer: >60 mL/min (ref >60)
GFR calc non Af Amer: >60 mL/min (ref >60)
Glucose, Bld: 108 mg/dL — ABNORMAL HIGH (ref 70–99)
Potassium: 3.7 mmol/L (ref 3.5–5.1)
Sodium: 142 mmol/L (ref 135–145)

## 2019-07-18 LAB — CBC
HCT: 40 % (ref 36.0–46.0)
Hemoglobin: 12.3 g/dL (ref 12.0–15.0)
MCH: 25.8 pg — ABNORMAL LOW (ref 26.0–34.0)
MCHC: 30.8 g/dL (ref 30.0–36.0)
MCV: 84 fL (ref 80.0–100.0)
Platelets: 234 10*3/uL (ref 150–400)
RBC: 4.76 MIL/uL (ref 3.87–5.11)
RDW: 13 % (ref 11.5–15.5)
WBC: 3.8 10*3/uL — ABNORMAL LOW (ref 4.0–10.5)
nRBC: 0 % (ref 0.0–0.2)

## 2019-07-18 LAB — HEPATIC FUNCTION PANEL
ALT: 15 U/L (ref 0–44)
AST: 20 U/L (ref 15–41)
Albumin: 3.9 g/dL (ref 3.5–5.0)
Alkaline Phosphatase: 70 U/L (ref 38–126)
Bilirubin, Direct: 0.1 mg/dL (ref 0.0–0.2)
Indirect Bilirubin: 0.9 mg/dL (ref 0.3–0.9)
Total Bilirubin: 1 mg/dL (ref 0.3–1.2)
Total Protein: 7.2 g/dL (ref 6.5–8.1)

## 2019-07-18 LAB — POC SARS CORONAVIRUS 2 AG -  ED: SARS Coronavirus 2 Ag: NEGATIVE

## 2019-07-18 LAB — LIPASE, BLOOD: Lipase: 26 U/L (ref 11–51)

## 2019-07-18 LAB — SARS CORONAVIRUS 2 (TAT 6-24 HRS): SARS Coronavirus 2: NEGATIVE

## 2019-07-18 LAB — TROPONIN I (HIGH SENSITIVITY): Troponin I (High Sensitivity): 5 ng/L (ref <18)

## 2019-07-18 MED ORDER — LORAZEPAM 2 MG/ML IJ SOLN
0.5000 mg | Freq: Once | INTRAMUSCULAR | Status: AC
  Administered 2019-07-18: 0.5 mg via INTRAVENOUS
  Filled 2019-07-18: qty 1

## 2019-07-18 MED ORDER — IOHEXOL 350 MG/ML SOLN
100.0000 mL | Freq: Once | INTRAVENOUS | Status: AC | PRN
  Administered 2019-07-18: 100 mL via INTRAVENOUS

## 2019-07-18 NOTE — ED Notes (Signed)
Patient verbalizes understanding of discharge instructions. Opportunity for questioning and answers were provided. Armband removed by staff, pt discharged from ED ambujlatory.

## 2019-07-18 NOTE — ED Provider Notes (Signed)
[facility] EMERGENCY DEPARTMENT Provider Note   CSN: [id number] Arrival date & time: 07/18/19  1215     History   Chief Complaint Chief Complaint  Patient presents with  . Chest Pain  . Abdominal Pain    HPI Alexandria Mack is a 58 y.o. female.     HPI  58 year old female history hypertension, fibromyalgia, anxiety presents today complaining of substernal chest pain and possible Covid exposure.  She states that she had family over for Thanksgiving.  Her son's girlfriend had some sneezing and runny nose which the girlfriend attributed to her seasonal allergies.  The patient's husband has also had some runny nose and symptoms he has attributed to his seasonal allergies.  The patient began having some sore throat, nasal congestion, headache, and cough on Saturday.  She was concerned that she could have been exposed to Covid.  She has had some chest discomfort that began on Tuesday.  It was substernal, squeezing, but fleeting and occurred multiple times on Tuesday throughout the day.  She is unable to tell me that any symptoms were associated with this.  She did not have any symptoms of it yesterday.  Today while she was driving to have a Covid test, she began having some chest discomfort again.  Again, it came and went and she is not currently having the pain.  She describes it as pressure and 5 out of 10 when it occurs.  She did not take her reflux medication today.  She states that she takes Bystolic for her high blood pressure but her blood pressure generally runs high.  It usually runs 1 40-1 50.  She is followed by Dr. Vista Lawman.  Past Medical History:  Diagnosis Date  . Anxiety 2012   managed with medications   . Chronic pain syndrome   . Depression 2012  . Diverticulitis   . External hemorrhoid   . Fibroid 2010   uterine fibroids  . Fibromyalgia 2010  . Hemorrhoids, internal   . Hypertension 2004  . Morbid obesity ([place])   . OSA (obstructive sleep  apnea) 2013   no C Pap    Patient Active Problem List   Diagnosis Date Noted  . External hemorrhoid   . Hemorrhoids, internal   . Uterine fibroid 04/25/2013    Past Surgical History:  Procedure Laterality Date  . APPENDECTOMY  age 47  . COLONOSCOPY  2004  . TUBAL LIGATION Bilateral 1996  . VAGINAL DELIVERY     x3     OB History    Gravida  3   Para  3   Term      Preterm      AB      Living  3     SAB      TAB      Ectopic      Multiple      Live Births               Home Medications    Prior to Admission medications   Medication Sig Start Date End Date Taking? Authorizing Provider  Aspirin-Acetaminophen-Caffeine (GOODY HEADACHE PO) Take 1 packet by mouth daily as needed (headache).    [provider]  diphenhydrAMINE (BENADRYL) 25 MG tablet Take 25 mg by mouth every 6 (six) hours as needed for allergies.    [provider]  nebivolol (BYSTOLIC) 10 MG tablet Take 1 tablet (10 mg total) by mouth daily. 09/04/18   Carlisle Cater, PA-C  UNABLE TO FIND Take by mouth. Med Name:  CBD Gummies    [provider]    Family History Family History  Problem Relation Age of Onset  . Hypertension Father   . Alzheimer's disease Father   . Heart disease Father   . Diabetes Father   . Mental illness Mother   . Diabetes Mother   . Breast cancer Sister 51       chemo, mastectomy on left  . Diabetes Sister     Social History Social History   Tobacco Use  . Smoking status: Never Smoker  . Smokeless tobacco: Never Used  Substance Use Topics  . Alcohol use: Yes    Comment: occasional  . Drug use: Yes    Types: Marijuana    Comment: tried for treatment of fibromyalgia - stopped secondaryy to burning in mouth     Allergies   Codeine   Review of Systems Review of Systems  All other systems reviewed and are negative.    Physical Exam Updated Vital Signs BP (!) 221/114 (BP Location: Left Arm)   Pulse (!) 59   Temp  98.7 F (37.1 C) (Oral)   Resp 16   LMP 08/16/2007 (Approximate)   SpO2 100%   Physical Exam Vitals signs and nursing note reviewed.  Constitutional:      General: She is not in acute distress.    Appearance: She is obese. She is not ill-appearing, toxic-appearing or diaphoretic.  HENT:     Head: Normocephalic and atraumatic.  Eyes:     Pupils: Pupils are equal, round, and reactive to light.  Neck:     Musculoskeletal: Normal range of motion and neck supple.  Cardiovascular:     Rate and Rhythm: Normal rate and regular rhythm.     Heart sounds: Normal heart sounds.  Pulmonary:     Effort: Pulmonary effort is normal.     Breath sounds: Normal breath sounds.  Abdominal:     General: Bowel sounds are normal.     Palpations: Abdomen is soft.  Musculoskeletal: Normal range of motion.  Skin:    General: Skin is warm and dry.     Capillary Refill: Capillary refill takes less than 2 seconds.  Neurological:     General: No focal deficit present.     Mental Status: She is alert and oriented to person, place, and time.     Cranial Nerves: No cranial nerve deficit.     Motor: No weakness.  Psychiatric:        Mood and Affect: Mood normal.        Behavior: Behavior normal.      ED Treatments / Results  Labs (all labs ordered are listed, but only abnormal results are displayed) Labs Reviewed - No data to display  EKG None  Radiology No results found.  Procedures Procedures (including critical care time)  Medications Ordered in ED Medications - No data to display   Initial Impression / Assessment and Plan / ED Course  I have reviewed the triage vital signs and the nursing notes.  Pertinent labs & imaging results that were available during my care of the patient were reviewed by me and considered in my medical decision making (see chart for details).    1-congestion, cough/uri- suspect viral syndrome.  POC covid negative, pcr pending.  Patient with normal sats and  vital signs.  Advised re quarantine until pcr test is back 2- chest pain- atypical, DDX Dissection PE ACS Ms CT  obtained with stable nodule and some perihilar nodes- discussed with patient and understands need to be aware and inform pmd Atypical chest pain with normal trop and ekg- patient advised recheck if worsens 3-hypertension- chronic in nature and runs sbp 150,  Suspect worsened here due to anxiety and ED hypertension syndrome.  Patient with some decrease with ativan.  Patient advised to have recheck with her PMD and add medications if advised.  SHe is also f/u with Dr. Letta Median noted on ct- patient with normal wbc, lft, bili.  No pain with eating, nausea or vomiitng.   Advised of sxs to be alert for 5 ct nodule and nodes- nodule stable from last ct .  Nonspecific mediastinal adneopathy   Final Clinical Impressions(s) / ED Diagnoses   Final diagnoses:  Upper respiratory tract infection, unspecified type  Calculus of gallbladder without cholecystitis without obstruction  Pulmonary nodule  Hilar adenopathy  Chest pain, unspecified type  Hypertension, unspecified type    ED Discharge Orders    None       Pattricia Boss, MD 07/18/19 1555

## 2019-07-18 NOTE — Discharge Instructions (Addendum)
Recheck with your doctor for bp There is a gallstone on your ct Return if worsening pain or shortness of breath Recheck in my chart for covid results and quarantine until negative or as below    Person Under Monitoring Name: Alexandria Mack  Location: [place] [id number]   Infection Prevention Recommendations for Individuals Confirmed to have, or Being Evaluated for, 2019 Novel Coronavirus (COVID-19) Infection Who Receive Care at Home  Individuals who are confirmed to have, or are being evaluated for, COVID-19 should follow the prevention steps below until a healthcare provider or local or state health department says they can return to normal activities.  Stay home except to get medical care You should restrict activities outside your home, except for getting medical care. Do not go to work, school, or public areas, and do not use public transportation or taxis.  Call ahead before visiting your doctor Before your medical appointment, call the healthcare provider and tell them that you have, or are being evaluated for, COVID-19 infection. This will help the healthcare providers office take steps to keep other people from getting infected. Ask your healthcare provider to call the local or state health department.  Monitor your symptoms Seek prompt medical attention if your illness is worsening (e.g., difficulty breathing). Before going to your medical appointment, call the healthcare provider and tell them that you have, or are being evaluated for, COVID-19 infection. Ask your healthcare provider to call the local or state health department.  Wear a facemask You should wear a facemask that covers your nose and mouth when you are in the same room with other people and when you visit a healthcare provider. People who live with or visit you should also wear a facemask while they are in the same room with you.  Separate yourself from other people in your home As  much as possible, you should stay in a different room from other people in your home. Also, you should use a separate bathroom, if available.  Avoid sharing household items You should not share dishes, drinking glasses, cups, eating utensils, towels, bedding, or other items with other people in your home. After using these items, you should wash them thoroughly with soap and water.  Cover your coughs and sneezes Cover your mouth and nose with a tissue when you cough or sneeze, or you can cough or sneeze into your sleeve. Throw used tissues in a lined trash can, and immediately wash your hands with soap and water for at least 20 seconds or use an alcohol-based hand rub.  Wash your [facility] your hands often and thoroughly with soap and water for at least 20 seconds. You can use an alcohol-based hand sanitizer if soap and water are not available and if your hands are not visibly dirty. Avoid touching your eyes, nose, and mouth with unwashed hands.   Prevention Steps for Caregivers and Household Members of Individuals Confirmed to have, or Being Evaluated for, COVID-19 Infection Being Cared for in the Home  If you live with, or provide care at home for, a person confirmed to have, or being evaluated for, COVID-19 infection please follow these guidelines to prevent infection:  Follow healthcare providers instructions Make sure that you understand and can help the patient follow any healthcare provider instructions for all care.  Provide for the patients basic needs You should help the patient with basic needs in the home and provide support for getting groceries, prescriptions, and other personal needs.  Monitor the patients symptoms If they are getting sicker, call his or her medical provider and tell them that the patient has, or is being evaluated for, COVID-19 infection. This will help the healthcare providers office take steps to keep other people from getting infected. Ask  the healthcare provider to call the local or state health department.  Limit the number of people who have contact with the patient If possible, have only one caregiver for the patient. Other household members should stay in another home or place of residence. If this is not possible, they should stay in another room, or be separated from the patient as much as possible. Use a separate bathroom, if available. Restrict visitors who do not have an essential need to be in the home.  Keep older adults, very young children, and other sick people away from the patient Keep older adults, very young children, and those who have compromised immune systems or chronic health conditions away from the patient. This includes people with chronic heart, lung, or kidney conditions, diabetes, and cancer.  Ensure good ventilation Make sure that shared spaces in the home have good air flow, such as from an air conditioner or an opened window, weather permitting.  Wash your hands often Wash your hands often and thoroughly with soap and water for at least 20 seconds. You can use an alcohol based hand sanitizer if soap and water are not available and if your hands are not visibly dirty. Avoid touching your eyes, nose, and mouth with unwashed hands. Use disposable paper towels to dry your hands. If not available, use dedicated cloth towels and replace them when they become wet.  Wear a facemask and gloves Wear a disposable facemask at all times in the room and gloves when you touch or have contact with the patients blood, body fluids, and/or secretions or excretions, such as sweat, saliva, sputum, nasal mucus, vomit, urine, or feces.  Ensure the mask fits over your nose and mouth tightly, and do not touch it during use. Throw out disposable facemasks and gloves after using them. Do not reuse. Wash your hands immediately after removing your facemask and gloves. If your personal clothing becomes contaminated,  carefully remove clothing and launder. Wash your hands after handling contaminated clothing. Place all used disposable facemasks, gloves, and other waste in a lined container before disposing them with other household waste. Remove gloves and wash your hands immediately after handling these items.  Do not share dishes, glasses, or other household items with the patient Avoid sharing household items. You should not share dishes, drinking glasses, cups, eating utensils, towels, bedding, or other items with a patient who is confirmed to have, or being evaluated for, COVID-19 infection. After the person uses these items, you should wash them thoroughly with soap and water.  Wash laundry thoroughly Immediately remove and wash clothes or bedding that have blood, body fluids, and/or secretions or excretions, such as sweat, saliva, sputum, nasal mucus, vomit, urine, or feces, on them. Wear gloves when handling laundry from the patient. Read and follow directions on labels of laundry or clothing items and detergent. In general, wash and dry with the warmest temperatures recommended on the label.  Clean all areas the individual has used often Clean all touchable surfaces, such as counters, tabletops, doorknobs, bathroom fixtures, toilets, phones, keyboards, tablets, and bedside tables, every day. Also, clean any surfaces that may have blood, body fluids, and/or secretions or excretions on them. Wear gloves when cleaning surfaces the patient has  come in contact with. Use a diluted bleach solution (e.g., dilute bleach with 1 part bleach and 10 parts water) or a household disinfectant with a label that says EPA-registered for coronaviruses. To make a bleach solution at home, add 1 tablespoon of bleach to 1 quart (4 cups) of water. For a larger supply, add  cup of bleach to 1 gallon (16 cups) of water. Read labels of cleaning products and follow recommendations provided on product labels. Labels contain  instructions for safe and effective use of the cleaning product including precautions you should take when applying the product, such as wearing gloves or eye protection and making sure you have good ventilation during use of the product. Remove gloves and wash hands immediately after cleaning.  Monitor yourself for signs and symptoms of illness Caregivers and household members are considered close contacts, should monitor their health, and will be asked to limit movement outside of the home to the extent possible. Follow the monitoring steps for close contacts listed on the symptom monitoring form.   ? If you have additional questions, contact your local health department or call the epidemiologist on call at [phone] (available 24/7). ? This guidance is subject to change. For the most up-to-date guidance from [facility], please refer to their website: [employer]pl

## 2019-07-18 NOTE — ED Triage Notes (Signed)
Pt arrives to ED from home with complaints of centralized substernal chest pain since Tuesday of this week and LUQ abdominal pain. Patient also complains of congestion, sore throat, and cough sine this weekend. Pt states husband has been sick for the past week ut has not gotten tested for COVID.

## 2019-07-19 DIAGNOSIS — I10 Essential (primary) hypertension: Secondary | ICD-10-CM | POA: Diagnosis not present

## 2019-07-19 DIAGNOSIS — R05 Cough: Secondary | ICD-10-CM | POA: Diagnosis not present

## 2019-07-19 DIAGNOSIS — R911 Solitary pulmonary nodule: Secondary | ICD-10-CM | POA: Diagnosis not present

## 2019-07-19 DIAGNOSIS — F418 Other specified anxiety disorders: Secondary | ICD-10-CM | POA: Diagnosis not present

## 2019-07-24 ENCOUNTER — Encounter: Payer: Self-pay | Admitting: Cardiology

## 2019-07-24 ENCOUNTER — Ambulatory Visit (INDEPENDENT_AMBULATORY_CARE_PROVIDER_SITE_OTHER): Payer: BLUE CROSS/BLUE SHIELD | Admitting: Internal Medicine

## 2019-07-24 ENCOUNTER — Other Ambulatory Visit: Payer: Self-pay

## 2019-07-24 ENCOUNTER — Ambulatory Visit (INDEPENDENT_AMBULATORY_CARE_PROVIDER_SITE_OTHER): Payer: BLUE CROSS/BLUE SHIELD | Admitting: Cardiology

## 2019-07-24 ENCOUNTER — Encounter: Payer: Self-pay | Admitting: Internal Medicine

## 2019-07-24 VITALS — BP 197/100 | HR 53 | Temp 98.2°F | Ht 60.0 in | Wt 190.0 lb

## 2019-07-24 DIAGNOSIS — I2510 Atherosclerotic heart disease of native coronary artery without angina pectoris: Secondary | ICD-10-CM | POA: Diagnosis not present

## 2019-07-24 DIAGNOSIS — R0609 Other forms of dyspnea: Secondary | ICD-10-CM | POA: Diagnosis not present

## 2019-07-24 DIAGNOSIS — R739 Hyperglycemia, unspecified: Secondary | ICD-10-CM

## 2019-07-24 DIAGNOSIS — I10 Essential (primary) hypertension: Secondary | ICD-10-CM | POA: Diagnosis not present

## 2019-07-24 DIAGNOSIS — R590 Localized enlarged lymph nodes: Secondary | ICD-10-CM

## 2019-07-24 DIAGNOSIS — R0789 Other chest pain: Secondary | ICD-10-CM | POA: Diagnosis not present

## 2019-07-24 DIAGNOSIS — R0600 Dyspnea, unspecified: Secondary | ICD-10-CM

## 2019-07-24 LAB — CBC WITH DIFFERENTIAL/PLATELET
Basophils Absolute: 0 10*3/uL (ref 0.0–0.1)
Basophils Relative: 0.8 % (ref 0.0–3.0)
Eosinophils Absolute: 0.1 10*3/uL (ref 0.0–0.7)
Eosinophils Relative: 2.6 % (ref 0.0–5.0)
HCT: 39.6 % (ref 36.0–46.0)
Hemoglobin: 12.7 g/dL (ref 12.0–15.0)
Lymphocytes Relative: 35.3 % (ref 12.0–46.0)
Lymphs Abs: 1.6 10*3/uL (ref 0.7–4.0)
MCHC: 32 g/dL (ref 30.0–36.0)
MCV: 80.2 fl (ref 78.0–100.0)
Monocytes Absolute: 0.4 10*3/uL (ref 0.1–1.0)
Monocytes Relative: 9.8 % (ref 3.0–12.0)
Neutro Abs: 2.3 10*3/uL (ref 1.4–7.7)
Neutrophils Relative %: 51.5 % (ref 43.0–77.0)
Platelets: 278 10*3/uL (ref 150.0–400.0)
RBC: 4.93 Mil/uL (ref 3.87–5.11)
RDW: 13.5 % (ref 11.5–15.5)
WBC: 4.4 10*3/uL (ref 4.0–10.5)

## 2019-07-24 LAB — SEDIMENTATION RATE: Sed Rate: 50 mm/hr — ABNORMAL HIGH (ref 0–30)

## 2019-07-24 MED ORDER — AMLODIPINE BESYLATE 10 MG PO TABS: 10.0000 mg | ORAL_TABLET | Freq: Every evening | ORAL | 2 refills | Status: DC

## 2019-07-24 MED ORDER — SPIRONOLACTONE 25 MG PO TABS: 25.0000 mg | ORAL_TABLET | ORAL | 2 refills | Status: DC

## 2019-07-24 NOTE — Progress Notes (Signed)
Primary Physician/Referring:  Benito Mccreedy, MD  Patient ID: Alexandria Mack, female    DOB: [birth year], 58 y.o.   MRN: [id number]  Chief Complaint  Patient presents with  . Chest Pain  . Dizziness  . Palpitations  . New Patient (Initial Visit)   HPI:    Alexandria Mack  is a 58 y.o. AAF with fibromyalgia, impaired fasting glucose, moderate obesity, depression and anemia presents to reestablish care, last seen in 2014 for chest pain. In December 2014, had a negative treadmill stress test and she had started to lose weight.  Continue medical therapy was recommended.  She now presents to the office for further repeat symptoms.  Presented to the emergency room on 08/17/2018 with uncontrolled hypertension, chest pain, shortness of breath, underwent CT angiogram of the chest and abdomen and also ruled out for myocardial infarction and discharged home with recommendation to follow-up with cardiology.  States that over the past 4 to 6 months he has noticed gradual worsening shortness of breath with exertional activity.  She has also noticed her blood pressure to be elevated.  She has had occasional episodes of chest tightness in the middle of the chest.  No PND or orthopnea.  Has occasional palpitations but states that they have remained stable.  Past Medical History:  Diagnosis Date  . Anxiety 2012   managed with medications   . Chronic pain syndrome   . Depression 2012  . Diverticulitis   . External hemorrhoid   . Fibroid 2010   uterine fibroids  . Fibromyalgia 2010  . Hemorrhoids, internal   . Hypertension 2004  . Morbid obesity ([place])   . OSA (obstructive sleep apnea) 2013   no C Pap   Past Surgical History:  Procedure Laterality Date  . APPENDECTOMY  age 19  . COLONOSCOPY  2004  . TUBAL LIGATION Bilateral 1996  . VAGINAL DELIVERY     x3   Social History   Socioeconomic History  . Marital status: Married    Spouse name: Not on file  . Number of children: 3  .  Years of education: Not on file  . Highest education level: Not on file  Occupational History  . Not on file  Social Needs  . Financial resource strain: Not on file  . Food insecurity    Worry: Not on file    Inability: Not on file  . Transportation needs    Medical: Not on file    Non-medical: Not on file  Tobacco Use  . Smoking status: Never Smoker  . Smokeless tobacco: Never Used  Substance and Sexual Activity  . Alcohol use: Not Currently  . Drug use: Yes    Types: Marijuana    Comment: tried for treatment of fibromyalgia - stopped secondaryy to burning in mouth  . Sexual activity: Yes    Birth control/protection: Post-menopausal  Lifestyle  . Physical activity    Days per week: Not on file    Minutes per session: Not on file  . Stress: Not on file  Relationships  . Social Herbalist on phone: Not on file    Gets together: Not on file    Attends religious service: Not on file    Active member of club or organization: Not on file    Attends meetings of clubs or organizations: Not on file    Relationship status: Not on file  . Intimate partner violence    Fear of current or ex partner:  Not on file    Emotionally abused: Not on file    Physically abused: Not on file    Forced sexual activity: Not on file  Other Topics Concern  . Not on file  Social History Narrative  . Not on file   ROS  Review of Systems  Constitution: Positive for malaise/fatigue. Negative for chills, decreased appetite and weight gain.  Cardiovascular: Positive for chest pain, dyspnea on exertion and palpitations (chronic). Negative for leg swelling and syncope.  Respiratory: Negative for hemoptysis.   Endocrine: Negative for cold intolerance.  Hematologic/Lymphatic: Does not bruise/bleed easily.  Musculoskeletal: Positive for back pain, joint pain and neck pain. Negative for joint swelling.  Gastrointestinal: Positive for heartburn and hemorrhoids. Negative for abdominal pain,  anorexia, change in bowel habit, hematochezia and melena.  Neurological: Negative for headaches and light-headedness.  Psychiatric/Behavioral: Negative for depression and substance abuse. The patient is nervous/anxious.   All other systems reviewed and are negative.  Objective   Vitals with BMI 07/24/2019 07/24/2019 07/18/2019  Height 5\' 0"  5\' 0"  -  Weight 190 lbs 190 lbs -  BMI [id number] [id number] -  Systolic XX123456 0000000 -  Diastolic 123XX123 94 -  Pulse 53 58 51      Physical Exam  Constitutional:  She is moderately built and moderately obese in no acute distress.  HENT:  Head: Atraumatic.  Eyes: Conjunctivae are normal.  Neck: Neck supple. No JVD present. No thyromegaly present.  Cardiovascular: Normal rate, regular rhythm, normal heart sounds and intact distal pulses. Exam reveals no gallop.  No murmur heard. No leg edema, no JVD.  Pulmonary/Chest: Effort normal and breath sounds normal.  Abdominal: Soft. Bowel sounds are normal.  Musculoskeletal: Normal range of motion.  Neurological: She is alert.  Skin: Skin is warm and dry.  Psychiatric: She has a normal mood and affect.   Laboratory examination:   Recent Labs    09/04/18 1409 07/18/19 1322  NA 142 142  K 3.7 3.7  CL 106 106  CO2 24 26  GLUCOSE 94 108*  BUN 17 12  CREATININE 0.78 1.02*  CALCIUM 10.0 9.2  GFRNONAA >60 >60  GFRAA >60 >60   estimated creatinine clearance is 59.4 mL/min (A) (by C-G formula based on SCr of 1.02 mg/dL (H)).  CMP Latest Ref Rng & Units 07/18/2019 09/04/2018 07/11/2016  Glucose 70 - 99 mg/dL 108(H) 94 130(H)  BUN 6 - 20 mg/dL 12 17 16   Creatinine 0.44 - 1.00 mg/dL 1.02(H) 0.78 0.95  Sodium 135 - 145 mmol/L 142 142 141  Potassium 3.5 - 5.1 mmol/L 3.7 3.7 4.3  Chloride 98 - 111 mmol/L 106 106 106  CO2 22 - 32 mmol/L 26 24 25   Calcium 8.9 - 10.3 mg/dL 9.2 10.0 10.1  Total Protein 6.5 - 8.1 g/dL 7.2 8.4(H) 7.7  Total Bilirubin 0.3 - 1.2 mg/dL 1.0 0.8 0.7  Alkaline Phos 38 - 126 U/L 70 82 89  AST  15 - 41 U/L 20 20 23   ALT 0 - 44 U/L 15 18 17    CBC Latest Ref Rng & Units 07/24/2019 07/18/2019 09/04/2018  WBC 4.0 - 10.5 K/uL 4.4 3.8(L) 4.3  Hemoglobin 12.0 - 15.0 g/dL 12.7 12.3 13.6  Hematocrit 36.0 - 46.0 % 39.6 40.0 47.9(H)  Platelets 150.0 - 400.0 K/uL 278.0 234 262   Lipid Panel  No results found for: CHOL, TRIG, HDL, CHOLHDL, VLDL, LDLCALC, LDLDIRECT HEMOGLOBIN A1C No results found for: HGBA1C, MPG TSH No results for input(s): TSH  in the last 8760 hours. Medications and allergies   Allergies  Allergen Reactions  . Codeine Nausea And Vomiting     Current Outpatient Medications  Medication Instructions  . amLODipine (NORVASC) 10 mg, Oral, Every evening  . Aspirin-Acetaminophen-Caffeine (GOODY HEADACHE PO) 1 packet, Oral, Daily PRN  . diphenhydrAMINE (BENADRYL) 25 mg, Oral, Every 6 hours PRN  . hydrOXYzine (ATARAX/VISTARIL) 25 mg, Oral, Every 6 hours PRN  . nebivolol (BYSTOLIC) 10 mg, Oral, Daily  . spironolactone (ALDACTONE) 25 mg, Oral, BH-each morning    Radiology:  CT scan of the chest and abdomen 07/18/2019: CTA FINDINGS  1. Normal caliber thoracoabdominal aorta.  No dissection. 2. Minor descending thoracic aorta atherosclerosis. Coronary artery calcification. 3. Thoracic and abdominal aortic branch vessels are widely patent. No RAS.  NON CTA FINDINGS  1. In the chest there is mild anterior mediastinal adenopathy largest node 1.4 cm in short axis. This was not included on the field of view from the prior abdomen and pelvis CT. Adenopathy is nonspecific, likely reactive. 2. No acute findings in the lungs. 3. Irregular appearance of the gallbladder. Gallstones with possible wall thickening. If there are symptoms are suspicious for acute cholecystitis, follow-up limited right upper quadrant ultrasound would be recommended. 4. No other evidence of an acute abnormality within the abdomen or pelvis.  Cardiac Studies:   Stress EKG 08/13/2013: Indications: Chest  pain. Conclusions: Negative for ischemia. Normal exercise tolerence. Continue primary prevention. Symptoms: Marked fatigue. Arrhythmia: None. The baseline ECG showed NSR,Normal EKG. During exercise there was No ST-T changes of ischemia. The patient exercised according to the Bruce protocol, Total time recorded 6 Min. 30 sec. achieving a max heart rate of 142 which was 84% of MPHR for age and 7.8 METS of work. Normal BP response.  Outside Echocardiogram 06/21/2013: Mild LVH, EF 66%, mild aortic valve thickening. Trace MR, TR, no evidence of pulmonary hypertension.  Assessment     ICD-10-CM   1. Primary hypertension  I10 EKG 12-Lead    TSH    Lipid Panel With LDL/HDL Ratio    LDL cholesterol, direct    amLODipine (NORVASC) 10 MG tablet    spironolactone (ALDACTONE) 25 MG tablet    Basic Metabolic Panel (BMET)  2. Atypical chest pain  R07.89 PCV ECHOCARDIOGRAM COMPLETE    PCV MYOCARDIAL PERFUSION WITH LEXISCAN  3. Dyspnea on exertion  R06.00 PCV MYOCARDIAL PERFUSION WITH LEXISCAN  4. Coronary artery calcification seen on CAT scan  I25.10 Lipid Panel With LDL/HDL Ratio    LDL cholesterol, direct    PCV ECHOCARDIOGRAM COMPLETE    PCV MYOCARDIAL PERFUSION WITH LEXISCAN  5. Hyperglycemia  R73.9 Hgb A1c w/o eAG    EKG 07/24/2019: Normal sinus rhythm at rate of 52 bpm, left atrial abnormality.  No evidence of ischemia.     Recommendations:   Meds ordered this encounter  Medications  . amLODipine (NORVASC) 10 MG tablet    Sig: Take 1 tablet (10 mg total) by mouth every evening.    Dispense:  30 tablet    Refill:  2  . spironolactone (ALDACTONE) 25 MG tablet    Sig: Take 1 tablet (25 mg total) by mouth every morning.    Dispense:  30 tablet    Refill:  2    Jariyah D Mack  is a 58 y.o. AAF with fibromyalgia, impaired fasting glucose, moderate obesity, depression and anemia presents to reestablish care, last seen in 2014 for chest pain. In December 2014, had a negative treadmill stress  test and she had started to lose weight.  Continue medical therapy was recommended.  She now presents to the office for further repeat symptoms.  Presented to the emergency room on 08/17/2018 with uncontrolled hypertension, chest pain, shortness of breath,  Chest pain symptoms appear to be atypical at most, cannot exclude angina pectoris. She does have uncontrolled hypertension but she is only on one medication.  I'll start her on Aldactone 25 mg in the morning, amlodipine 10 mg in the evening, she'll continue with Bystolic.  Schedule for a Lexiscan Sestamibi stress test to evaluate for myocardial ischemia. Patient unable to do treadmill stress testing due to Covid 19 to decrease exposore. Will schedule for an echocardiogram.   Weight loss stressed again and gave positive reinforcement. [facility] diet (low glycemic) sheet given to the patient.  Discussed regarding DASH diet and salt restriction.   Check Labs see orders and Office visit following the work-up/investigations. Adrian Prows, MD, [facility] 07/24/2019, 2:57 PM [facility]. [place] Pager: [phone] Office: [phone] If no answer Cell [phone]

## 2019-07-24 NOTE — Patient Instructions (Addendum)
To get the most out of exercise, you need to be continuously aware that you are short of breath, but never out of breath, for 30 minutes daily. As you improve, it will actually be easier for you to do the same amount of exercise  in  30 minutes so always push to the level where you are short of breath.     GERD (REFLUX)  is an extremely common cause of respiratory symptoms just like yours , many times with no obvious heartburn at all.    It can be treated with medication, but also with lifestyle changes including elevation of the head of your bed (ideally with 6 -8inch blocks under the headboard of your bed),  Smoking cessation, avoidance of late meals, excessive alcohol, and avoid fatty foods, chocolate, peppermint, colas, red wine, and acidic juices such as orange juice.  NO MINT OR MENTHOL PRODUCTS SO NO COUGH DROPS  USE SUGARLESS CANDY INSTEAD (Jolley ranchers or Stover's or Life Savers) or even ice chips will also do - the key is to swallow to prevent all throat clearing. NO OIL BASED VITAMINS - use powdered substitutes.  Avoid fish oil when coughing.    Please remember to go to the lab department   for your tests - we will call you with the results when they are available.   I will review all your studies with radiology and call you if anything further is needed

## 2019-07-24 NOTE — Progress Notes (Signed)
[name], female    DOB: 06/14/1961      MRN: [id number]   Brief patient profile:  58 yo female never smoker  from [place] healthy child but poor ex tol never saw specialist but improving as adult with regular walks with husband but since Jan 2020 having spells of dizzy/lightheadedness 3-5x assoc with with palp/ chest discomfort sometimes s cough /sob  - spells last for a day, typically work related as never occur on weekends, seen in ER with one of her spells plus acute cough/st ruled out for MI and covid 19 but CTa done 07/18/2019 for PE :  prevascular LN's largest 1.4 cm and stable RLL 3 mm nodule vs prior from 07/11/16      History of Present Illness  07/24/2019  Pulmonary/ 1st office eval/Alexandria Mack  Chief Complaint  Patient presents with  . Pulmonary Consult    Referred by Dr Vista Lawman for eval of pulmonary nodule.   Dyspnea:  Walking up to an hour s stopping except  sometimes on hills.  Cough: none  Sleep: better p benadryl/ on side  SABA use: none   No obvious day to day or daytime variability or assoc excess/ purulent sputum or mucus plugs or hemoptysis or  subjective wheeze or overt sinus or hb symptoms.   Sleeping  without nocturnal  or early am exacerbation  of respiratory  c/o's or need for noct saba. Also denies any obvious fluctuation of symptoms with weather or environmental changes or other aggravating or alleviating factors except as outlined above   No unusual exposure hx or h/o childhood pna/ asthma or knowledge of premature birth.  Current Allergies, Complete Past Medical History, Past Surgical History, Family History, and Social History were reviewed in [employer] record.  ROS  The following are not active complaints unless bolded Hoarseness, sore throat, dysphagia, dental problems, itching, sneezing,  nasal congestion or discharge of excess mucus or purulent secretions, ear ache,   fever, chills, sweats, unintended wt loss or wt gain,  classically pleuritic or exertional cp,  orthopnea pnd or arm/hand swelling  or leg swelling, presyncope, palpitations, abdominal pain, anorexia, nausea, vomiting, diarrhea  or change in bowel habits or change in bladder habits, change in stools or change in urine, dysuria, hematuria,  rash, arthralgias, visual complaints, headache, numbness, weakness or ataxia or problems with walking or coordination,  change in mood or  memory.           Past Medical History:  Diagnosis Date  . Anxiety 2012   managed with medications   . Chronic pain syndrome   . Depression 2012  . Diverticulitis   . External hemorrhoid   . Fibroid 2010   uterine fibroids  . Fibromyalgia 2010  . Hemorrhoids, internal   . Hypertension 2004  . Morbid obesity ([place])   . OSA (obstructive sleep apnea) 2013   no C Pap    Outpatient Medications Prior to Visit  Medication Sig Dispense Refill  . Aspirin-Acetaminophen-Caffeine (GOODY HEADACHE PO) Take 1 packet by mouth daily as needed (headache).    . diphenhydrAMINE (BENADRYL) 25 MG tablet Take 25 mg by mouth every 6 (six) hours as needed for allergies.    . hydrOXYzine (ATARAX/VISTARIL) 25 MG tablet Take 25 mg by mouth 3 (three) times daily as needed.    . nebivolol (BYSTOLIC) 10 MG tablet Take 1 tablet (10 mg total) by mouth daily. 15 tablet 0  . UNABLE TO FIND Take by mouth. Med  Name:  CBD Gummies        Objective:     BP (!) 160/94 (BP Location: Left Arm, Cuff Size: Large)   Pulse (!) 58   Temp 97.7 F (36.5 C) (Temporal)   Ht 5' (1.524 m)   Wt 190 lb (86.2 kg)   LMP 08/16/2007 (Approximate)   SpO2 98% Comment: on RA  BMI 37.11 kg/m   SpO2: 98 %(on RA)   Pleasant amb mod obese  wf nad   HEENT : pt wearing mask not removed for exam due to covid -19 concerns.    NECK :  without JVD/Nodes/TM/ nl carotid upstrokes bilaterally   LUNGS: no acc muscle use,  Nl contour chest which is clear to A and P bilaterally without cough on insp or exp maneuvers    CV:  RRR  no s3 or murmur or increase in P2, and no edema   ABD: obese soft and nontender with nl inspiratory excursion in the supine position. No bruits or organomegaly appreciated, bowel sounds nl  MS:  Nl gait/ ext warm without deformities, calf tenderness, cyanosis or clubbing No obvious joint restrictions   SKIN: warm and dry without lesions    NEURO:  alert, approp, nl sensorium with  no motor or cerebellar deficits apparent.    Labs ordered/ reviewed:      Chemistry      Component Value Date/Time   NA 142 07/18/2019 1322   K 3.7 07/18/2019 1322   CL 106 07/18/2019 1322   CO2 26 07/18/2019 1322   BUN 12 07/18/2019 1322   CREATININE 1.02 (H) 07/18/2019 1322      Component Value Date/Time   CALCIUM 9.2 07/18/2019 1322   ALKPHOS 70 07/18/2019 1322   AST 20 07/18/2019 1322   ALT 15 07/18/2019 1322   BILITOT 1.0 07/18/2019 1322        Lab Results  Component Value Date   WBC 4.4 07/24/2019   HGB 12.7 07/24/2019   HCT 39.6 07/24/2019   MCV 80.2 07/24/2019   PLT 278.0 07/24/2019       EOS                                                              0.1                                      07/24/2019          Lab Results  Component Value Date   ESRSEDRATE 50 (H) 07/24/2019     Labs ordered 07/24/2019  :   [name]   Angiotensin       Assessment   Mediastinal adenopathy See cta 07/18/2019  With largest node 1.3 cm and stable RLL 3 mm nodule since 07/11/16 - 07/24/2019 esr = 50 - 07/24/2019 Quant Gold TB  - 07/24/2019 Angiotensin   Re nodule: CT results reviewed with pt >>> Too small for PET or bx, not suspicious enough for excisional bx > really only option for now is follow the Fleischner society guidelines as rec by radiology > no directed f/u in this setting    Re Adenopathy In otherwise aysmptomatic (from pulmonary perspective) never smokers  without known primary malignancy ! 1.4 cm max she doe not need a bx at this point (especially since this  would not be easy to access in its present location and would require PET first to see if other easier access nodes are present)  So  the only issue is whether to do a repeat study in 3-6  m to  to confirm this also is a completely benign process.   Discussed in detail all the  indications, usual  risks and alternatives  relative to the benefits with patient who agrees to proceed with conservative f/u with ct chest with contrast at 3 months but can be delayed up to 6 months if covid 19 restrictions are still in place.   Total time devoted to counseling  > 50 % of initial 60 min office visit:  review case with pt/ discussion of options/alternatives/ personally creating written customized instructions  in presence of pt  then going over those specific  Instructions directly with the pt including how to use all of the meds but in particular covering each new medication in detail and the difference between the maintenance= "automatic" meds and the prns using an action plan format for the latter (If this problem/symptom => do that organization reading Left to right).  Please see AVS from this visit for a full list of these instructions which I personally wrote for this pt and  are unique to this visit.      Christinia Gully, MD 07/24/2019

## 2019-07-25 ENCOUNTER — Other Ambulatory Visit: Payer: Self-pay | Admitting: Gastroenterology

## 2019-07-25 ENCOUNTER — Encounter: Payer: Self-pay | Admitting: Internal Medicine

## 2019-07-25 DIAGNOSIS — R933 Abnormal findings on diagnostic imaging of other parts of digestive tract: Secondary | ICD-10-CM | POA: Diagnosis not present

## 2019-07-25 DIAGNOSIS — K601 Chronic anal fissure: Secondary | ICD-10-CM | POA: Diagnosis not present

## 2019-07-25 DIAGNOSIS — R140 Abdominal distension (gaseous): Secondary | ICD-10-CM | POA: Diagnosis not present

## 2019-07-25 DIAGNOSIS — K5904 Chronic idiopathic constipation: Secondary | ICD-10-CM | POA: Diagnosis not present

## 2019-07-25 DIAGNOSIS — R1011 Right upper quadrant pain: Secondary | ICD-10-CM

## 2019-07-25 NOTE — Assessment & Plan Note (Addendum)
See cta 07/18/2019  With largest node 1.3 cm and stable RLL 3 mm nodule since 07/11/16  - 07/24/2019 esr = 50 - 07/24/2019 Quant Gold TB  - 07/24/2019 Angiotensin   Re nodule: CT results reviewed with pt >>> Too small for PET or bx, not suspicious enough for excisional bx > really only option for now is follow the Fleischner society guidelines as rec by radiology > no directed f/u in this setting    Re Adenopathy In otherwise aysmptomatic (from pulmonary perspective) never smokers  without known primary malignancy ! 1.4 cm max she doe not need a bx at this point (especially since this would not be easy to access in its present location and would require PET first to see if other easier access nodes are present)  So  the only issue is whether to do a repeat study in 3-6  m to  to confirm this also is a completely benign process.   Discussed in detail all the  indications, usual  risks and alternatives  relative to the benefits with patient who agrees to proceed with conservative f/u with ct chest with contrast at 3 months but can be delayed up to 6 months if covid 19 restrictions are still in place.   Total time devoted to counseling  > 50 % of initial 60 min office visit:  review case with pt/ discussion of options/alternatives/ personally creating written customized instructions  in presence of pt  then going over those specific  Instructions directly with the pt including how to use all of the meds but in particular covering each new medication in detail and the difference between the maintenance= "automatic" meds and the prns using an action plan format for the latter (If this problem/symptom => do that organization reading Left to right).  Please see AVS from this visit for a full list of these instructions which I personally wrote for this pt and  are unique to this visit.

## 2019-07-26 ENCOUNTER — Ambulatory Visit
Admission: RE | Admit: 2019-07-26 | Discharge: 2019-07-26 | Disposition: A | Discharge status: Home / Self Care | Payer: BLUE CROSS/BLUE SHIELD | Source: Ambulatory Visit | Attending: Gastroenterology | Admitting: Gastroenterology

## 2019-07-26 DIAGNOSIS — R1011 Right upper quadrant pain: Secondary | ICD-10-CM

## 2019-07-26 DIAGNOSIS — K8020 Calculus of gallbladder without cholecystitis without obstruction: Secondary | ICD-10-CM | POA: Diagnosis not present

## 2019-07-26 LAB — QUANTIFERON-TB GOLD PLUS
Mitogen-NIL: >10 IU/mL
NIL: 0.03 IU/mL
QuantiFERON-TB Gold Plus: NEGATIVE
TB1-NIL: 0 IU/mL
TB2-NIL: 0 IU/mL

## 2019-07-26 LAB — ANGIOTENSIN CONVERTING ENZYME: Angiotensin-Converting Enzyme: 28 U/L (ref 9–67)

## 2019-07-30 ENCOUNTER — Ambulatory Visit (INDEPENDENT_AMBULATORY_CARE_PROVIDER_SITE_OTHER): Payer: BLUE CROSS/BLUE SHIELD

## 2019-07-30 ENCOUNTER — Other Ambulatory Visit: Payer: Self-pay

## 2019-07-30 DIAGNOSIS — R0789 Other chest pain: Secondary | ICD-10-CM

## 2019-07-30 DIAGNOSIS — I2510 Atherosclerotic heart disease of native coronary artery without angina pectoris: Secondary | ICD-10-CM | POA: Diagnosis not present

## 2019-08-02 DIAGNOSIS — R739 Hyperglycemia, unspecified: Secondary | ICD-10-CM | POA: Diagnosis not present

## 2019-08-02 DIAGNOSIS — I2510 Atherosclerotic heart disease of native coronary artery without angina pectoris: Secondary | ICD-10-CM | POA: Diagnosis not present

## 2019-08-02 DIAGNOSIS — I10 Essential (primary) hypertension: Secondary | ICD-10-CM | POA: Diagnosis not present

## 2019-08-03 LAB — LIPID PANEL WITH LDL/HDL RATIO
Cholesterol, Total: 198 mg/dL (ref 100–199)
HDL: 72 mg/dL (ref >39)
LDL Chol Calc (NIH): 106 mg/dL — ABNORMAL HIGH (ref 0–99)
LDL/HDL Ratio: 1.5 ratio (ref 0.0–3.2)
Triglycerides: 116 mg/dL (ref 0–149)
VLDL Cholesterol Cal: 20 mg/dL (ref 5–40)

## 2019-08-03 LAB — BASIC METABOLIC PANEL
BUN/Creatinine Ratio: 13 (ref 9–23)
BUN: 13 mg/dL (ref 6–24)
CO2: 22 mmol/L (ref 20–29)
Calcium: 10.7 mg/dL — ABNORMAL HIGH (ref 8.7–10.2)
Chloride: 103 mmol/L (ref 96–106)
Creatinine, Ser: 0.99 mg/dL (ref 0.57–1.00)
GFR calc Af Amer: 73 mL/min/{1.73_m2} (ref >59)
GFR calc non Af Amer: 63 mL/min/{1.73_m2} (ref >59)
Glucose: 110 mg/dL — ABNORMAL HIGH (ref 65–99)
Potassium: 4.5 mmol/L (ref 3.5–5.2)
Sodium: 141 mmol/L (ref 134–144)

## 2019-08-03 LAB — TSH: TSH: 1.19 u[IU]/mL (ref 0.450–4.500)

## 2019-08-03 LAB — LDL CHOLESTEROL, DIRECT: LDL Direct: 103 mg/dL — ABNORMAL HIGH (ref 0–99)

## 2019-08-03 LAB — HGB A1C W/O EAG: Hgb A1c MFr Bld: 5.7 % — ABNORMAL HIGH (ref 4.8–5.6)

## 2019-08-03 NOTE — Progress Notes (Signed)
LDL is very mildly elevated.  Has prediabetes.

## 2019-08-05 ENCOUNTER — Other Ambulatory Visit: Payer: Self-pay

## 2019-08-05 ENCOUNTER — Ambulatory Visit (INDEPENDENT_AMBULATORY_CARE_PROVIDER_SITE_OTHER): Payer: BLUE CROSS/BLUE SHIELD

## 2019-08-05 DIAGNOSIS — R0600 Dyspnea, unspecified: Secondary | ICD-10-CM

## 2019-08-05 DIAGNOSIS — R0609 Other forms of dyspnea: Secondary | ICD-10-CM | POA: Diagnosis not present

## 2019-08-05 DIAGNOSIS — R0789 Other chest pain: Secondary | ICD-10-CM

## 2019-08-05 DIAGNOSIS — I2510 Atherosclerotic heart disease of native coronary artery without angina pectoris: Secondary | ICD-10-CM | POA: Diagnosis not present

## 2019-09-05 ENCOUNTER — Other Ambulatory Visit: Payer: Self-pay

## 2019-09-05 ENCOUNTER — Ambulatory Visit (INDEPENDENT_AMBULATORY_CARE_PROVIDER_SITE_OTHER): Payer: BC Managed Care – PPO | Admitting: Cardiology

## 2019-09-05 ENCOUNTER — Encounter: Payer: Self-pay | Admitting: Cardiology

## 2019-09-05 VITALS — BP 136/74 | HR 51 | Temp 97.6°F | Resp 16 | Ht 60.0 in | Wt 182.3 lb

## 2019-09-05 DIAGNOSIS — R0600 Dyspnea, unspecified: Secondary | ICD-10-CM | POA: Diagnosis not present

## 2019-09-05 DIAGNOSIS — I2510 Atherosclerotic heart disease of native coronary artery without angina pectoris: Secondary | ICD-10-CM | POA: Diagnosis not present

## 2019-09-05 DIAGNOSIS — R739 Hyperglycemia, unspecified: Secondary | ICD-10-CM

## 2019-09-05 DIAGNOSIS — I10 Essential (primary) hypertension: Secondary | ICD-10-CM | POA: Diagnosis not present

## 2019-09-05 DIAGNOSIS — E7800 Pure hypercholesterolemia, unspecified: Secondary | ICD-10-CM

## 2019-09-05 DIAGNOSIS — Z6835 Body mass index (BMI) 35.0-35.9, adult: Secondary | ICD-10-CM

## 2019-09-05 DIAGNOSIS — R0609 Other forms of dyspnea: Secondary | ICD-10-CM

## 2019-09-05 MED ORDER — ROSUVASTATIN CALCIUM 5 MG PO TABS: 5.0000 mg | ORAL_TABLET | Freq: Every day | ORAL | 2 refills | Status: DC

## 2019-09-05 NOTE — Progress Notes (Signed)
Primary Physician/Referring:  Benito Mccreedy, MD  Patient ID: Gertie Baron, female    DOB: 1961-08-11, 59 y.o.   MRN: [id number]  Chief Complaint  Patient presents with  . Hypertension  . Shortness of Breath  . Follow-up    6 week - CP, Labs   HPI:    Lenetta D Vanhorn  is a 59 y.o.   fibromyalgia, impaired fasting glucose, moderate obesity, depression and anemia presented to the emergency room on 08/17/2018 with uncontrolled hypertension, chest pain, shortness of breath. States that over the past 4 to 6 months he has noticed gradual worsening shortness of breath with exertional activity.  Has occasional palpitations but states that they have remained stable.  She underwent stress testing and echocardiogram and presents for follow-up.  Tolerating Aldactone and amlodipine that was started previously.  Since last office visit she has noticed marked improvement in overall well-being, she is made lifestyle changes, dyspnea has improved.  She has not had any further chest pain.  Past Medical History:  Diagnosis Date  . Anxiety 2012   managed with medications   . Chronic pain syndrome   . Depression 2012  . Diverticulitis   . External hemorrhoid   . Fibroid 2010   uterine fibroids  . Fibromyalgia 2010  . Hemorrhoids, internal   . Hypertension 2004  . Morbid obesity ([place])   . OSA (obstructive sleep apnea) 2013   no C Pap   Past Surgical History:  Procedure Laterality Date  . APPENDECTOMY  age 48  . COLONOSCOPY  2004  . TUBAL LIGATION Bilateral 1996  . VAGINAL DELIVERY     x3   Social History   Socioeconomic History  . Marital status: Married    Spouse name: Not on file  . Number of children: 3  . Years of education: Not on file  . Highest education level: Not on file  Occupational History  . Not on file  Tobacco Use  . Smoking status: Never Smoker  . Smokeless tobacco: Never Used  Substance and Sexual Activity  . Alcohol use: Not Currently  . Drug use: Not  Currently    Types: Marijuana    Comment: tried for treatment of fibromyalgia - stopped secondaryy to burning in mouth  . Sexual activity: Yes    Birth control/protection: Post-menopausal  Other Topics Concern  . Not on file  Social History Narrative  . Not on file   Social Determinants of Health   Financial Resource Strain:   . Difficulty of Paying Living Expenses: Not on file  Food Insecurity:   . Worried About Charity fundraiser in the Last Year: Not on file  . Ran Out of Food in the Last Year: Not on file  Transportation Needs:   . Lack of Transportation (Medical): Not on file  . Lack of Transportation (Non-Medical): Not on file  Physical Activity:   . Days of Exercise per Week: Not on file  . Minutes of Exercise per Session: Not on file  Stress:   . Feeling of Stress : Not on file  Social Connections:   . Frequency of Communication with Friends and Family: Not on file  . Frequency of Social Gatherings with Friends and Family: Not on file  . Attends Religious Services: Not on file  . Active Member of Clubs or Organizations: Not on file  . Attends Archivist Meetings: Not on file  . Marital Status: Not on file  Intimate Partner Violence:   .  Fear of Current or Ex-Partner: Not on file  . Emotionally Abused: Not on file  . Physically Abused: Not on file  . Sexually Abused: Not on file   ROS  Review of Systems  Constitution: Positive for malaise/fatigue. Negative for chills, decreased appetite and weight gain.  Cardiovascular: Positive for dyspnea on exertion (chronic). Negative for chest pain, leg swelling, palpitations (chronic) and syncope.  Respiratory: Negative for hemoptysis.   Endocrine: Negative for cold intolerance.  Hematologic/Lymphatic: Does not bruise/bleed easily.  Musculoskeletal: Positive for back pain, joint pain and neck pain. Negative for joint swelling.  Gastrointestinal: Positive for heartburn and hemorrhoids. Negative for hematochezia and  melena.  Neurological: Negative for headaches and light-headedness.  Psychiatric/Behavioral: Negative for depression and substance abuse. The patient is nervous/anxious.   All other systems reviewed and are negative.  Objective   Vitals with BMI 09/05/2019 07/24/2019 07/24/2019  Height 5\' 0"  5\' 0"  5\' 0"   Weight 182 lbs 5 oz 190 lbs 190 lbs  BMI 35.6 [id number]  Systolic XX123456 XX123456 0000000  Diastolic 74 123XX123 94  Pulse 51 53 58    Physical Exam  Constitutional:  She is moderately built and moderately obese in no acute distress.  HENT:  Head: Atraumatic.  Eyes: Conjunctivae are normal.  Neck: No thyromegaly present.  Cardiovascular: Normal rate, regular rhythm, normal heart sounds and intact distal pulses. Exam reveals no gallop.  No murmur heard. No leg edema, no JVD.  Pulmonary/Chest: Effort normal and breath sounds normal.  Abdominal: Soft. Bowel sounds are normal.  Musculoskeletal:     Cervical back: Neck supple.   Laboratory examination:   Recent Labs    07/18/19 1322 08/02/19 1034  NA 142 141  K 3.7 4.5  CL 106 103  CO2 26 22  GLUCOSE 108* 110*  BUN 12 13  CREATININE 1.02* 0.99  CALCIUM 9.2 10.7*  GFRNONAA >60 63  GFRAA >60 73   CrCl cannot be calculated (Patient's most recent lab result is older than the maximum 21 days allowed.).  CMP Latest Ref Rng & Units 08/02/2019 07/18/2019 09/04/2018  Glucose 65 - 99 mg/dL 110(H) 108(H) 94  BUN 6 - 24 mg/dL 13 12 17   Creatinine 0.57 - 1.00 mg/dL 0.99 1.02(H) 0.78  Sodium 134 - 144 mmol/L 141 142 142  Potassium 3.5 - 5.2 mmol/L 4.5 3.7 3.7  Chloride 96 - 106 mmol/L 103 106 106  CO2 20 - 29 mmol/L 22 26 24   Calcium 8.7 - 10.2 mg/dL 10.7(H) 9.2 10.0  Total Protein 6.5 - 8.1 g/dL - 7.2 8.4(H)  Total Bilirubin 0.3 - 1.2 mg/dL - 1.0 0.8  Alkaline Phos 38 - 126 U/L - 70 82  AST 15 - 41 U/L - 20 20  ALT 0 - 44 U/L - 15 18   CBC Latest Ref Rng & Units 07/24/2019 07/18/2019 09/04/2018  WBC 4.0 - 10.5 K/uL 4.4 3.8(L) 4.3  Hemoglobin  12.0 - 15.0 g/dL 12.7 12.3 13.6  Hematocrit 36.0 - 46.0 % 39.6 40.0 47.9(H)  Platelets 150.0 - 400.0 K/uL 278.0 234 262   Lipid Panel     Component Value Date/Time   CHOL 198 08/02/2019 1034   TRIG 116 08/02/2019 1034   HDL 72 08/02/2019 1034   LDLCALC 106 (H) 08/02/2019 1034   LDLDIRECT 103 (H) 08/02/2019 1034   HEMOGLOBIN A1C Lab Results  Component Value Date   HGBA1C 5.7 (H) 08/02/2019   TSH Recent Labs    08/02/19 1034  TSH 1.190  Medications and allergies   Allergies  Allergen Reactions  . Codeine Nausea And Vomiting     Current Outpatient Medications  Medication Instructions  . amLODipine (NORVASC) 10 mg, Oral, Every evening  . hydrOXYzine (ATARAX/VISTARIL) 25 mg, Oral, Every 6 hours PRN  . nebivolol (BYSTOLIC) 10 mg, Oral, Daily  . rosuvastatin (CRESTOR) 5 mg, Oral, Daily  . spironolactone (ALDACTONE) 25 mg, Oral, BH-each morning    Radiology:  CT scan of the chest and abdomen 07/18/2019: CTA FINDINGS 1. Normal caliber thoracoabdominal aorta.  No dissection. 2. Minor descending thoracic aorta atherosclerosis. Coronary artery calcification. 3. Thoracic and abdominal aortic branch vessels are widely patent. No RAS.  NON CTA FINDINGS 1. In the chest there is mild anterior mediastinal adenopathy largest node 1.4 cm in short axis. This was not included on the field of view from the prior abdomen and pelvis CT. Adenopathy is nonspecific, likely reactive. 2. No acute findings in the lungs. 3. Irregular appearance of the gallbladder. Gallstones with possible wall thickening. If there are symptoms are suspicious for acute cholecystitis, follow-up limited right upper quadrant ultrasound would be recommended. 4. No other evidence of an acute abnormality within the abdomen or pelvis.  Cardiac Studies:   Stress EKG 08/13/2013: Indications: Chest pain. Conclusions: Negative for ischemia. Normal exercise tolerence. Continue primary prevention. Symptoms: Marked  fatigue. Arrhythmia: None. The baseline ECG showed NSR,Normal EKG. During exercise there was No ST-T changes of ischemia. The patient exercised according to the Bruce protocol, Total time recorded 6 Min. 30 sec. achieving a max heart rate of 142 which was 84% of MPHR for age and 7.8 METS of work. Normal BP response.  Lexiscan Sestamibi Stress Test 08/05/19  Non-diagnostic ECG stress due to pharmacologic stress testing. Normal myocardial perfusion. All segments of left ventricle demonstrated normal wall motion and thickening. Stress LV EF is normal 51%.  No previous exam available for comparison. Low risk stress test.   Echocardiogram 07/30/2019: Left ventricle cavity is normal in size and wall thickness. Normal global wall motion. Normal LV systolic function with EF 55%. Normal diastolic filling pattern.  Trileaflet aortic valve.  Trace aortic regurgitation. Mild (Grade I) mitral regurgitation. Mild tricuspid regurgitation. Estimated pulmonary artery systolic pressure is 25 mmHg.  Assessment     ICD-10-CM   1. Primary hypertension  I10   2. Dyspnea on exertion  R06.00   3. Coronary artery calcification seen on CAT scan  I25.10 rosuvastatin (CRESTOR) 5 MG tablet  4. Hyperglycemia  R73.9   5. Hypercholesteremia  E78.00 rosuvastatin (CRESTOR) 5 MG tablet    Lipid Panel With LDL/HDL Ratio    Lipid Panel With LDL/HDL Ratio  6. Class 2 severe obesity due to excess calories with serious comorbidity and body mass index (BMI) of 35.0 to 35.9 in adult [facility])  E66.01    Z68.35     EKG 07/24/2019: Normal sinus rhythm at rate of 52 bpm, left atrial abnormality.  No evidence of ischemia.     Recommendations:   Meds ordered this encounter  Medications  . rosuvastatin (CRESTOR) 5 MG tablet    Sig: Take 1 tablet (5 mg total) by mouth daily.    Dispense:  30 tablet    Refill:  2    Lyza D Mexicano  is a 59 y.o. AAF with fibromyalgia, impaired fasting glucose, moderate obesity, depression and  anemia presented to the emergency room on 08/17/2018 with uncontrolled hypertension, chest pain, shortness of breath.  I have reviewed the results of the stress  test and echocardiogram with the patient, although she has coronary calcification noted on the CT scan, does not appear to have significant coronary disease with normal coronary perfusion noted by nuclear perfusion study.  Control of risk factors were discussed for primary prevention.  She is presently tolerating Aldactone 25 mg in the morning, amlodipine 10 mg in the evening, she'll continue with Bystolic. Blood pressure is now well controlled. Blood pressures at home have been in 110-120 mmHg range.  Reviewed her lipid profile testing, in view of coronary calcification and aortic atherosclerosis, would recommend starting low-dose high intensity statin.  Weight loss was again discussed. She has made significant lifestyle changes as well.    I have discussed hyperglycemia, obesity related issues including hyperlipidemia as well.  Lifestyle changes were reinforced.  Her labs were reviewed.    She can return to work without restrictions from cardiac standpoint. I will see her back in 3 months after lipids and to f/u HTN and weight loss.    Adrian Prows, MD, [facility] 09/05/2019, 11:48 AM [facility] [place]

## 2019-09-14 ENCOUNTER — Other Ambulatory Visit: Payer: Self-pay | Admitting: Cardiology

## 2019-09-14 DIAGNOSIS — I10 Essential (primary) hypertension: Secondary | ICD-10-CM

## 2019-09-18 ENCOUNTER — Other Ambulatory Visit: Payer: Self-pay | Admitting: Cardiology

## 2019-09-18 DIAGNOSIS — I10 Essential (primary) hypertension: Secondary | ICD-10-CM

## 2019-09-21 DIAGNOSIS — Z1231 Encounter for screening mammogram for malignant neoplasm of breast: Secondary | ICD-10-CM | POA: Diagnosis not present

## 2019-10-11 ENCOUNTER — Telehealth: Payer: Self-pay | Admitting: *Deleted

## 2019-10-11 DIAGNOSIS — R590 Localized enlarged lymph nodes: Secondary | ICD-10-CM

## 2019-10-11 NOTE — Telephone Encounter (Signed)
Spoke with the pt and notified of recs per MW  She verbalized understanding  She states not having any GI issues at this time and wants to go ahead and proceed with scan now  Order placed

## 2019-10-11 NOTE — Telephone Encounter (Signed)
-----   Message from Tanda Rockers, MD sent at 07/25/2019 11:05 AM EST ----- Needs ct with contrast f/u med adenopathy first week in march but ok to delay another month if covid restrictions still in place or if she's still dealing with all of her GI issues

## 2019-10-21 ENCOUNTER — Ambulatory Visit (INDEPENDENT_AMBULATORY_CARE_PROVIDER_SITE_OTHER)
Admission: RE | Admit: 2019-10-21 | Discharge: 2019-10-21 | Disposition: A | Discharge status: Home / Self Care | Payer: BC Managed Care – PPO | Source: Ambulatory Visit | Attending: Internal Medicine | Admitting: Internal Medicine

## 2019-10-21 ENCOUNTER — Other Ambulatory Visit: Payer: Self-pay

## 2019-10-21 DIAGNOSIS — R590 Localized enlarged lymph nodes: Secondary | ICD-10-CM | POA: Diagnosis not present

## 2019-10-21 DIAGNOSIS — R918 Other nonspecific abnormal finding of lung field: Secondary | ICD-10-CM | POA: Diagnosis not present

## 2019-10-21 MED ORDER — IOHEXOL 300 MG/ML  SOLN
75.0000 mL | Freq: Once | INTRAMUSCULAR | Status: AC | PRN
  Administered 2019-10-21: 80 mL via INTRAVENOUS

## 2019-10-22 NOTE — Progress Notes (Signed)
Spoke with pt and notified of results per Dr. Wert. Pt verbalized understanding and denied any questions. 

## 2019-10-26 ENCOUNTER — Other Ambulatory Visit: Payer: Self-pay | Admitting: Cardiology

## 2019-10-26 DIAGNOSIS — I2510 Atherosclerotic heart disease of native coronary artery without angina pectoris: Secondary | ICD-10-CM

## 2019-10-26 DIAGNOSIS — E7800 Pure hypercholesterolemia, unspecified: Secondary | ICD-10-CM

## 2019-12-06 NOTE — Progress Notes (Signed)
Primary Physician/Referring:  Benito Mccreedy, MD  Patient ID: Alexandria Mack, female    DOB: 1961/03/07, 59 y.o.   MRN: [id number]  Chief Complaint  Patient presents with  . Hypertension  . Hyperlipidemia  . Follow-up    3 month   HPI:    Alexandria Mack  is a 59 y.o. Caucasian female with fibromyalgia, impaired fasting glucose, hypertension, hyperlipidemia, moderate obesity, coronary calcification noted on the CT scan, depression, dyspnea on exertion and occasional palpitations presents here for a 58-month office visit of hypertension specifically and hyperlipidemia.    She has not had any further chest pain, palpitation symptoms are remained stable, she is trying her best to lose weight.  Tolerating all her medications well.  Past Medical History:  Diagnosis Date  . Anxiety 2012   managed with medications   . Chronic pain syndrome   . Depression 2012  . Diverticulitis   . External hemorrhoid   . Fibroid 2010   uterine fibroids  . Fibromyalgia 2010  . Hemorrhoids, internal   . Hypertension 2004  . Morbid obesity ([place])   . OSA (obstructive sleep apnea) 2013   no C Pap   Past Surgical History:  Procedure Laterality Date  . APPENDECTOMY  age 16  . COLONOSCOPY  2004  . TUBAL LIGATION Bilateral 1996  . VAGINAL DELIVERY     x3   Social History   Tobacco Use  . Smoking status: Never Smoker  . Smokeless tobacco: Never Used  Substance Use Topics  . Alcohol use: Not Currently    Marital Status: Married  ROS  Review of Systems  Cardiovascular: Positive for dyspnea on exertion (stable). Negative for chest pain and leg swelling.  Musculoskeletal: Positive for arthritis and back pain.  Gastrointestinal: Positive for hemorrhoids. Negative for melena.   Objective   Vitals with BMI 12/09/2019 09/05/2019 07/24/2019  Height 5\' 0"  5\' 0"  5\' 0"   Weight 179 lbs 182 lbs 5 oz 190 lbs  BMI 34.96 [id number]  Systolic XX123456 XX123456 XX123456  Diastolic 82 74 123XX123  Pulse 54 51 53     Physical Exam  Constitutional:  She is moderately built and moderately obese in no acute distress.  Cardiovascular: Normal rate, regular rhythm, normal heart sounds and intact distal pulses. Exam reveals no gallop.  No murmur heard. Pulses:      Carotid pulses are on the left side with bruit. No leg edema, no JVD.  Pulmonary/Chest: Effort normal and breath sounds normal.  Abdominal: Soft. Bowel sounds are normal.  Musculoskeletal:     Cervical back: Neck supple.   Laboratory examination:   Recent Labs    07/18/19 1322 08/02/19 1034  NA 142 141  K 3.7 4.5  CL 106 103  CO2 26 22  GLUCOSE 108* 110*  BUN 12 13  CREATININE 1.02* 0.99  CALCIUM 9.2 10.7*  GFRNONAA >60 63  GFRAA >60 73   CrCl cannot be calculated (Patient's most recent lab result is older than the maximum 21 days allowed.).  CMP Latest Ref Rng & Units 08/02/2019 07/18/2019 09/04/2018  Glucose 65 - 99 mg/dL 110(H) 108(H) 94  BUN 6 - 24 mg/dL 13 12 17   Creatinine 0.57 - 1.00 mg/dL 0.99 1.02(H) 0.78  Sodium 134 - 144 mmol/L 141 142 142  Potassium 3.5 - 5.2 mmol/L 4.5 3.7 3.7  Chloride 96 - 106 mmol/L 103 106 106  CO2 20 - 29 mmol/L 22 26 24   Calcium 8.7 - 10.2 mg/dL 10.7(H) 9.2  10.0  Total Protein 6.5 - 8.1 g/dL - 7.2 8.4(H)  Total Bilirubin 0.3 - 1.2 mg/dL - 1.0 0.8  Alkaline Phos 38 - 126 U/L - 70 82  AST 15 - 41 U/L - 20 20  ALT 0 - 44 U/L - 15 18   CBC Latest Ref Rng & Units 07/24/2019 07/18/2019 09/04/2018  WBC 4.0 - 10.5 K/uL 4.4 3.8(L) 4.3  Hemoglobin 12.0 - 15.0 g/dL 12.7 12.3 13.6  Hematocrit 36.0 - 46.0 % 39.6 40.0 47.9(H)  Platelets 150.0 - 400.0 K/uL 278.0 234 262   Lipid Panel     Component Value Date/Time   CHOL 198 08/02/2019 1034   TRIG 116 08/02/2019 1034   HDL 72 08/02/2019 1034   LDLCALC 106 (H) 08/02/2019 1034   LDLDIRECT 103 (H) 08/02/2019 1034   HEMOGLOBIN A1C Lab Results  Component Value Date   HGBA1C 5.7 (H) 08/02/2019   TSH Recent Labs    08/02/19 1034  TSH 1.190    External Labs:   Medications and allergies   Allergies  Allergen Reactions  . Codeine Nausea And Vomiting     Current Outpatient Medications  Medication Instructions  . amLODipine (NORVASC) 10 MG tablet TAKE 1 TABLET BY MOUTH EVERY DAY IN THE EVENING  . aspirin EC 81 mg, Oral, Daily  . hydrOXYzine (ATARAX/VISTARIL) 25 mg, Oral, Every 6 hours PRN  . nebivolol (BYSTOLIC) 10 mg, Oral, Daily  . rosuvastatin (CRESTOR) 5 MG tablet TAKE 1 TABLET BY MOUTH EVERY DAY  . spironolactone (ALDACTONE) 25 MG tablet TAKE 1 TABLET BY MOUTH EVERY DAY IN THE MORNING   Radiology:   CT scan of the chest and abdomen 07/18/2019: CTA FINDINGS 1. Normal caliber thoracoabdominal aorta.  No dissection. 2. Minor descending thoracic aorta atherosclerosis. Coronary artery calcification. 3. Thoracic and abdominal aortic branch vessels are widely patent. No RAS.  NON CTA FINDINGS 1. In the chest there is mild anterior mediastinal adenopathy largest node 1.4 cm in short axis. This was not included on the field of view from the prior abdomen and pelvis CT. Adenopathy is nonspecific, likely reactive. 2. No acute findings in the lungs. 3. Irregular appearance of the gallbladder. Gallstones with possible wall thickening. If there are symptoms are suspicious for acute cholecystitis, follow-up limited right upper quadrant ultrasound would be recommended. 4. No other evidence of an acute abnormality within the abdomen or pelvis.  CT Chest W Contrast 10/21/2019: 1. Stable appearance of mildly enlarged prevascular and right internal mammary lymph nodes. 3 months of imaging stability is reassuring. Follow-up CT in 3-6 months recommended to ensure continued stability. 2. Stable tiny bilateral pulmonary nodules measuring up to 5 mm.   Cardiac Studies:   Stress EKG 08/13/2013: Indications: Chest pain. Conclusions: Negative for ischemia. Normal exercise tolerence. Continue primary prevention. Symptoms: Marked fatigue.  Arrhythmia: None. The baseline ECG showed NSR,Normal EKG. During exercise there was No ST-T changes of ischemia. The patient exercised according to the Bruce protocol, Total time recorded 6 Min. 30 sec. achieving a max heart rate of 142 which was 84% of MPHR for age and 7.8 METS of work. Normal BP response.  Echocardiogram 07/30/2019: Left ventricle cavity is normal in size and wall thickness. Normal global wall motion. Normal LV systolic function with EF 55%. Normal diastolic filling pattern.  Trileaflet aortic valve.  Trace aortic regurgitation. Mild (Grade I) mitral regurgitation. Mild tricuspid regurgitation. Estimated pulmonary artery systolic pressure is 25 mmHg.  Lexiscan Sestamibi Stress Test 08/05/19  Non-diagnostic ECG stress due  to pharmacologic stress testing. Normal myocardial perfusion. All segments of left ventricle demonstrated normal wall motion and thickening. Stress LV EF is normal 51%.  No previous exam available for comparison. Low risk stress test.   EKG:   07/24/2019: Normal sinus rhythm at rate of 52 bpm, left atrial abnormality.  No evidence of ischemia.     Assessment     ICD-10-CM   1. Primary hypertension  I10 nebivolol (BYSTOLIC) 10 MG tablet  2. Coronary artery calcification seen on CAT scan  I25.10 aspirin EC 81 MG tablet  3. Hypercholesteremia  E78.00 Lipid Panel With LDL/HDL Ratio  4. Left carotid bruit  R09.89 PCV CAROTID DUPLEX (BILATERAL)    Meds ordered this encounter  Medications  . aspirin EC 81 MG tablet    Sig: Take 1 tablet (81 mg total) by mouth daily.    Dispense:  90 tablet    Refill:  3  . nebivolol (BYSTOLIC) 10 MG tablet    Sig: Take 1 tablet (10 mg total) by mouth daily.    Dispense:  90 tablet    Refill:  3   Medications Discontinued During This Encounter  Medication Reason  . nebivolol (BYSTOLIC) 10 MG tablet Reorder   Recommendations:    Alexandria Mack  is a 59 y.o. Caucasian female with fibromyalgia, impaired fasting  glucose, hypertension, hyperlipidemia, moderate obesity, coronary calcification noted on the CT scan, depression, dyspnea on exertion and occasional palpitations presents here for a 81-month office visit  Her blood pressure has been very well controlled, previously had been difficult to control, she has been losing weight on a regular basis, overall dyspnea has also improved.  In view of coronary calcification noted on CT scan, advised her to start aspirin 81 mg daily.  Continue statins for now, she will need lipid profile testing.  I hear a new left carotid bruit that is fairly prominent, she will need carotid artery duplex.  I refilled a prescription for Bystolic, she is tolerating without side effects of fatigue, palpitation symptoms also improved.  I would like to see her back in 6 months for follow-up. If stable will see PRN.   Adrian Prows, MD, [facility] 12/09/2019, 6:31 PM [facility] [place] Office: [phone]

## 2019-12-09 ENCOUNTER — Ambulatory Visit: Payer: BC Managed Care – PPO | Admitting: Cardiology

## 2019-12-09 ENCOUNTER — Encounter: Payer: Self-pay | Admitting: Cardiology

## 2019-12-09 ENCOUNTER — Other Ambulatory Visit: Payer: Self-pay

## 2019-12-09 VITALS — BP 136/82 | HR 54 | Temp 96.5°F | Resp 16 | Ht 60.0 in | Wt 179.0 lb

## 2019-12-09 DIAGNOSIS — I10 Essential (primary) hypertension: Secondary | ICD-10-CM | POA: Diagnosis not present

## 2019-12-09 DIAGNOSIS — I2510 Atherosclerotic heart disease of native coronary artery without angina pectoris: Secondary | ICD-10-CM

## 2019-12-09 DIAGNOSIS — E7800 Pure hypercholesterolemia, unspecified: Secondary | ICD-10-CM

## 2019-12-09 DIAGNOSIS — R0989 Other specified symptoms and signs involving the circulatory and respiratory systems: Secondary | ICD-10-CM | POA: Diagnosis not present

## 2019-12-09 MED ORDER — ASPIRIN EC 81 MG PO TBEC: 81.0000 mg | DELAYED_RELEASE_TABLET | Freq: Every day | ORAL | 3 refills | Status: DC

## 2019-12-09 MED ORDER — NEBIVOLOL HCL 10 MG PO TABS: 10.0000 mg | ORAL_TABLET | Freq: Every day | ORAL | 3 refills | Status: DC

## 2019-12-10 ENCOUNTER — Other Ambulatory Visit: Payer: Self-pay

## 2019-12-10 DIAGNOSIS — I10 Essential (primary) hypertension: Secondary | ICD-10-CM

## 2019-12-10 MED ORDER — NEBIVOLOL HCL 10 MG PO TABS: 10.0000 mg | ORAL_TABLET | Freq: Every day | ORAL | 3 refills | Status: DC

## 2019-12-13 DIAGNOSIS — E7800 Pure hypercholesterolemia, unspecified: Secondary | ICD-10-CM | POA: Diagnosis not present

## 2019-12-14 LAB — LIPID PANEL WITH LDL/HDL RATIO
Cholesterol, Total: 132 mg/dL (ref 100–199)
HDL: 65 mg/dL (ref >39)
LDL Chol Calc (NIH): 53 mg/dL (ref 0–99)
LDL/HDL Ratio: 0.8 ratio (ref 0.0–3.2)
Triglycerides: 66 mg/dL (ref 0–149)
VLDL Cholesterol Cal: 14 mg/dL (ref 5–40)

## 2019-12-15 ENCOUNTER — Other Ambulatory Visit: Payer: Self-pay | Admitting: Cardiology

## 2019-12-15 DIAGNOSIS — I10 Essential (primary) hypertension: Secondary | ICD-10-CM

## 2020-01-06 ENCOUNTER — Other Ambulatory Visit: Payer: Self-pay

## 2020-01-06 ENCOUNTER — Ambulatory Visit: Payer: BC Managed Care – PPO

## 2020-01-06 ENCOUNTER — Telehealth: Payer: Self-pay

## 2020-01-06 DIAGNOSIS — R0989 Other specified symptoms and signs involving the circulatory and respiratory systems: Secondary | ICD-10-CM | POA: Diagnosis not present

## 2020-01-07 ENCOUNTER — Other Ambulatory Visit: Payer: Self-pay

## 2020-01-07 DIAGNOSIS — I10 Essential (primary) hypertension: Secondary | ICD-10-CM

## 2020-01-07 MED ORDER — NEBIVOLOL HCL 10 MG PO TABS: 10.0000 mg | ORAL_TABLET | Freq: Every day | ORAL | 3 refills | Status: DC

## 2020-01-15 NOTE — Telephone Encounter (Signed)
She can come in for EKG.

## 2020-02-10 ENCOUNTER — Other Ambulatory Visit: Payer: Self-pay | Admitting: Cardiology

## 2020-02-10 DIAGNOSIS — I2510 Atherosclerotic heart disease of native coronary artery without angina pectoris: Secondary | ICD-10-CM

## 2020-02-10 DIAGNOSIS — E7800 Pure hypercholesterolemia, unspecified: Secondary | ICD-10-CM

## 2020-04-16 ENCOUNTER — Other Ambulatory Visit: Payer: Self-pay | Admitting: Internal Medicine

## 2020-04-16 ENCOUNTER — Telehealth: Payer: Self-pay | Admitting: *Deleted

## 2020-04-16 DIAGNOSIS — R590 Localized enlarged lymph nodes: Secondary | ICD-10-CM

## 2020-04-16 NOTE — Telephone Encounter (Signed)
Per MW- due for CT Chest mid Sept---order to [facility]

## 2020-05-04 ENCOUNTER — Ambulatory Visit (INDEPENDENT_AMBULATORY_CARE_PROVIDER_SITE_OTHER)
Admission: RE | Admit: 2020-05-04 | Discharge: 2020-05-04 | Disposition: A | Discharge status: Home / Self Care | Payer: BC Managed Care – PPO | Source: Ambulatory Visit | Attending: Internal Medicine | Admitting: Internal Medicine

## 2020-05-04 ENCOUNTER — Other Ambulatory Visit: Payer: Self-pay

## 2020-05-04 DIAGNOSIS — R918 Other nonspecific abnormal finding of lung field: Secondary | ICD-10-CM | POA: Diagnosis not present

## 2020-05-04 DIAGNOSIS — R590 Localized enlarged lymph nodes: Secondary | ICD-10-CM

## 2020-05-04 DIAGNOSIS — K449 Diaphragmatic hernia without obstruction or gangrene: Secondary | ICD-10-CM | POA: Diagnosis not present

## 2020-05-04 DIAGNOSIS — R911 Solitary pulmonary nodule: Secondary | ICD-10-CM | POA: Diagnosis not present

## 2020-05-07 ENCOUNTER — Other Ambulatory Visit: Payer: Self-pay | Admitting: Internal Medicine

## 2020-05-07 DIAGNOSIS — R590 Localized enlarged lymph nodes: Secondary | ICD-10-CM

## 2020-05-07 NOTE — Progress Notes (Signed)
Spoke with pt and notified of results per Dr. Wert. Pt verbalized understanding and denied any questions. 

## 2020-06-11 ENCOUNTER — Other Ambulatory Visit: Payer: Self-pay

## 2020-06-11 ENCOUNTER — Encounter: Payer: Self-pay | Admitting: Cardiology

## 2020-06-11 ENCOUNTER — Ambulatory Visit: Payer: BC Managed Care – PPO | Admitting: Cardiology

## 2020-06-11 VITALS — BP 130/82 | HR 56 | Resp 15 | Ht 60.0 in | Wt 179.0 lb

## 2020-06-11 DIAGNOSIS — R0989 Other specified symptoms and signs involving the circulatory and respiratory systems: Secondary | ICD-10-CM

## 2020-06-11 DIAGNOSIS — I10 Essential (primary) hypertension: Secondary | ICD-10-CM | POA: Diagnosis not present

## 2020-06-11 DIAGNOSIS — E7800 Pure hypercholesterolemia, unspecified: Secondary | ICD-10-CM

## 2020-06-11 DIAGNOSIS — I2510 Atherosclerotic heart disease of native coronary artery without angina pectoris: Secondary | ICD-10-CM

## 2020-06-11 MED ORDER — ROSUVASTATIN CALCIUM 5 MG PO TABS: 5.0000 mg | ORAL_TABLET | Freq: Every day | ORAL | 3 refills | Status: DC

## 2020-06-11 MED ORDER — AMLODIPINE BESYLATE 10 MG PO TABS: ORAL_TABLET | ORAL | 3 refills | Status: DC

## 2020-06-11 NOTE — Progress Notes (Signed)
Primary Physician/Referring:  Alexandria Mccreedy, MD  Patient ID: Alexandria Mack, female    DOB: 1960/11/30, 59 y.o.   MRN: [id number]  Chief Complaint  Patient presents with  . Follow-up    6 month  . Hypertension  . Hyperlipidemia   HPI:    Alexandria Mack  is a 59 y.o. Caucasian female with fibromyalgia, impaired fasting glucose, hypertension, hyperlipidemia, moderate obesity, coronary calcification noted on the CT scan, depression, dyspnea on exertion and occasional palpitations presents here for a 23-month office visit of hypertension specifically and hyperlipidemia.  On her last office visit had added Crestor which she is tolerating, I repeated lipids and she now presents for a 22-month office visit. No specific complaints today. She recently received Covid vaccine and since then has had slight worsening myalgias. Denies chest pain, palpitations, dyspnea has remained stable. No PND or orthopnea.   Past Medical History:  Diagnosis Date  . Anxiety 2012   managed with medications   . Chronic pain syndrome   . Depression 2012  . Diverticulitis   . External hemorrhoid   . Fibroid 2010   uterine fibroids  . Fibromyalgia 2010  . Hemorrhoids, internal   . Hypertension 2004  . Morbid obesity ([place])   . OSA (obstructive sleep apnea) 2013   no C Pap   Past Surgical History:  Procedure Laterality Date  . APPENDECTOMY  age 30  . COLONOSCOPY  2004  . TUBAL LIGATION Bilateral 1996  . VAGINAL DELIVERY     x3   Social History   Tobacco Use  . Smoking status: Never Smoker  . Smokeless tobacco: Never Used  Substance Use Topics  . Alcohol use: Not Currently    Marital Status: Married  ROS  Review of Systems  Cardiovascular: Positive for dyspnea on exertion (stable). Negative for chest pain and leg swelling.  Musculoskeletal: Positive for arthritis and back pain.  Gastrointestinal: Positive for hemorrhoids. Negative for melena.   Objective   Vitals with BMI  06/11/2020 12/09/2019 09/05/2019  Height 5\' 0"  5\' 0"  5\' 0"   Weight 179 lbs 179 lbs 182 lbs 5 oz  BMI 34.96 41.66 06.3  Systolic 016 010 932  Diastolic 82 82 74  Pulse 56 54 51    Physical Exam Constitutional:      Comments: She is moderately built and moderately obese in no acute distress.  Cardiovascular:     Rate and Rhythm: Normal rate and regular rhythm.     Pulses: Intact distal pulses.          Carotid pulses are on the left side with bruit.    Heart sounds: Normal heart sounds. No murmur heard.  No gallop.      Comments: No leg edema, no JVD. Pulmonary:     Effort: Pulmonary effort is normal.     Breath sounds: Normal breath sounds.  Abdominal:     General: Bowel sounds are normal.     Palpations: Abdomen is soft.  Musculoskeletal:     Cervical back: Neck supple.    Laboratory examination:   Recent Labs    07/18/19 1322 08/02/19 1034  NA 142 141  K 3.7 4.5  CL 106 103  CO2 26 22  GLUCOSE 108* 110*  BUN 12 13  CREATININE 1.02* 0.99  CALCIUM 9.2 10.7*  GFRNONAA >60 63  GFRAA >60 73   CrCl cannot be calculated (Patient's most recent lab result is older than the maximum 21 days allowed.).  CMP Latest Ref  Rng & Units 08/02/2019 07/18/2019 09/04/2018  Glucose 65 - 99 mg/dL 110(H) 108(H) 94  BUN 6 - 24 mg/dL 13 12 17   Creatinine 0.57 - 1.00 mg/dL 0.99 1.02(H) 0.78  Sodium 134 - 144 mmol/L 141 142 142  Potassium 3.5 - 5.2 mmol/L 4.5 3.7 3.7  Chloride 96 - 106 mmol/L 103 106 106  CO2 20 - 29 mmol/L 22 26 24   Calcium 8.7 - 10.2 mg/dL 10.7(H) 9.2 10.0  Total Protein 6.5 - 8.1 g/dL - 7.2 8.4(H)  Total Bilirubin 0.3 - 1.2 mg/dL - 1.0 0.8  Alkaline Phos 38 - 126 U/L - 70 82  AST 15 - 41 U/L - 20 20  ALT 0 - 44 U/L - 15 18   CBC Latest Ref Rng & Units 07/24/2019 07/18/2019 09/04/2018  WBC 4.0 - 10.5 K/uL 4.4 3.8(L) 4.3  Hemoglobin 12.0 - 15.0 g/dL 12.7 12.3 13.6  Hematocrit 36 - 46 % 39.6 40.0 47.9(H)  Platelets 150 - 400 K/uL 278.0 234 262   Lipid Panel Recent  Labs    08/02/19 1034 12/13/19 1318  CHOL 198 132  TRIG 116 66  LDLCALC 106* 53  HDL 72 65  LDLDIRECT 103*  --     HEMOGLOBIN A1C Lab Results  Component Value Date   HGBA1C 5.7 (H) 08/02/2019   TSH Recent Labs    08/02/19 1034  TSH 1.190   External Labs:   Medications and allergies   Allergies  Allergen Reactions  . Codeine Nausea And Vomiting     Current Outpatient Medications  Medication Instructions  . amLODipine (NORVASC) 10 MG tablet TAKE 1 TABLET BY MOUTH EVERY DAY IN THE EVENING  . nebivolol (BYSTOLIC) 10 mg, Oral, Daily  . rosuvastatin (CRESTOR) 5 mg, Oral, Daily  . spironolactone (ALDACTONE) 25 MG tablet TAKE 1 TABLET BY MOUTH EVERY DAY IN THE MORNING   Radiology:   CT scan of the chest and abdomen 07/18/2019: CTA FINDINGS 1. Normal caliber thoracoabdominal aorta.  No dissection. 2. Minor descending thoracic aorta atherosclerosis. Coronary artery calcification. 3. Thoracic and abdominal aortic branch vessels are widely patent. No RAS.  NON CTA FINDINGS 1. In the chest there is mild anterior mediastinal adenopathy largest node 1.4 cm in short axis. This was not included on the field of view from the prior abdomen and pelvis CT. Adenopathy is nonspecific, likely reactive. 2. No acute findings in the lungs. 3. Irregular appearance of the gallbladder. Gallstones with possible wall thickening. If there are symptoms are suspicious for acute cholecystitis, follow-up limited right upper quadrant ultrasound would be recommended. 4. No other evidence of an acute abnormality within the abdomen or pelvis.  CT Chest W Contrast 10/21/2019: 1. Stable appearance of mildly enlarged prevascular and right internal mammary lymph nodes. 3 months of imaging stability is reassuring. Follow-up CT in 3-6 months recommended to ensure continued stability. 2. Stable tiny bilateral pulmonary nodules measuring up to 5 mm.  Cardiac Studies:   Stress EKG 08/13/2013: Indications:  Chest pain. Conclusions: Negative for ischemia. Normal exercise tolerence. Continue primary prevention. Symptoms: Marked fatigue. Arrhythmia: None. The baseline ECG showed NSR,Normal EKG. During exercise there was No ST-T changes of ischemia. The patient exercised according to the Bruce protocol, Total time recorded 6 Min. 30 sec. achieving a max heart rate of 142 which was 84% of MPHR for age and 7.8 METS of work. Normal BP response.  Echocardiogram 07/30/2019: Left ventricle cavity is normal in size and wall thickness. Normal global wall motion. Normal LV  systolic function with EF 55%. Normal diastolic filling pattern.  Trileaflet aortic valve.  Trace aortic regurgitation. Mild (Grade I) mitral regurgitation. Mild tricuspid regurgitation. Estimated pulmonary artery systolic pressure is 25 mmHg.  Lexiscan Sestamibi Stress Test 08/05/19  Non-diagnostic ECG stress due to pharmacologic stress testing. Normal myocardial perfusion. All segments of left ventricle demonstrated normal wall motion and thickening. Stress LV EF is normal 51%.  No previous exam available for comparison. Low risk stress test.   Carotid artery duplex  01/06/2020: No hemodynamically significant arterial disease in the internal carotid artery bilaterally. Antegrade right vertebral artery flow. Antegrade left vertebral artery flow.   EKG:   EKG 06/11/2020: Normal sinus rhythm at rate of 53 bpm, normal axis. No evidence of ischemia, normal EKG.  No significant change from 07/24/2019.  Assessment     ICD-10-CM   1. Primary hypertension  I10 EKG 12-Lead    amLODipine (NORVASC) 10 MG tablet  2. Hypercholesteremia  E78.00 rosuvastatin (CRESTOR) 5 MG tablet  3. Coronary artery calcification seen on CAT scan  I25.10 rosuvastatin (CRESTOR) 5 MG tablet  4. Left carotid bruit  R09.89     Meds ordered this encounter  Medications  . rosuvastatin (CRESTOR) 5 MG tablet    Sig: Take 1 tablet (5 mg total) by mouth daily.     Dispense:  90 tablet    Refill:  3  . amLODipine (NORVASC) 10 MG tablet    Sig: TAKE 1 TABLET BY MOUTH EVERY DAY IN THE EVENING    Dispense:  90 tablet    Refill:  3   Medications Discontinued During This Encounter  Medication Reason  . aspirin EC 81 MG tablet No longer needed (for PRN medications)  . hydrOXYzine (ATARAX/VISTARIL) 25 MG tablet No longer needed (for PRN medications)  . amLODipine (NORVASC) 10 MG tablet Reorder  . rosuvastatin (CRESTOR) 5 MG tablet Reorder   Recommendations:    Magdaline D Dulany  is a 59 y.o. Caucasian female with fibromyalgia, impaired fasting glucose, hypertension, hyperlipidemia, moderate obesity, coronary calcification noted on the CT scan, depression, dyspnea on exertion and occasional palpitations presents here for a 27-month office visit  Her blood pressure has been very well controlled, previously had been difficult to control. I also reviewed her labs, lipids under excellent control on minimal dose of Crestor without any side effects. Continue the same.  Carotid artery duplex reviewed, she has normal carotid artery.  From cardiac standpoint otherwise she remains stable, she is being appropriately treated for mild coronary calcification noted on the CT scan with statins. She does not need aspirin. I will see her back on a as needed basis.   Adrian Prows, MD, [facility] 06/11/2020, 3:54 PM Office: [phone] Pager: [phone]

## 2020-06-14 ENCOUNTER — Other Ambulatory Visit: Payer: Self-pay | Admitting: Cardiology

## 2020-06-14 DIAGNOSIS — I10 Essential (primary) hypertension: Secondary | ICD-10-CM

## 2020-07-16 ENCOUNTER — Other Ambulatory Visit: Payer: Self-pay

## 2020-07-16 DIAGNOSIS — I10 Essential (primary) hypertension: Secondary | ICD-10-CM

## 2020-07-16 MED ORDER — AMLODIPINE BESYLATE 10 MG PO TABS: ORAL_TABLET | ORAL | 3 refills | Status: DC

## 2020-07-23 ENCOUNTER — Other Ambulatory Visit: Payer: Self-pay

## 2020-07-23 ENCOUNTER — Ambulatory Visit (HOSPITAL_COMMUNITY)
Admission: EM | Admit: 2020-07-23 | Discharge: 2020-07-23 | Disposition: A | Discharge status: Home / Self Care | Payer: BC Managed Care – PPO | Attending: Family Medicine | Admitting: Family Medicine

## 2020-07-23 ENCOUNTER — Encounter (HOSPITAL_COMMUNITY): Payer: Self-pay | Admitting: Emergency Medicine

## 2020-07-23 DIAGNOSIS — R109 Unspecified abdominal pain: Secondary | ICD-10-CM

## 2020-07-23 DIAGNOSIS — M79602 Pain in left arm: Secondary | ICD-10-CM | POA: Diagnosis not present

## 2020-07-23 LAB — COMPREHENSIVE METABOLIC PANEL
ALT: 21 U/L (ref 0–44)
AST: 27 U/L (ref 15–41)
Albumin: 4.4 g/dL (ref 3.5–5.0)
Alkaline Phosphatase: 81 U/L (ref 38–126)
Anion gap: 14 (ref 5–15)
BUN: 16 mg/dL (ref 6–20)
CO2: 25 mmol/L (ref 22–32)
Calcium: 10.2 mg/dL (ref 8.9–10.3)
Chloride: 106 mmol/L (ref 98–111)
Creatinine, Ser: 0.9 mg/dL (ref 0.44–1.00)
GFR, Estimated: >60 mL/min (ref >60)
Glucose, Bld: 98 mg/dL (ref 70–99)
Potassium: 4.1 mmol/L (ref 3.5–5.1)
Sodium: 145 mmol/L (ref 135–145)
Total Bilirubin: 0.8 mg/dL (ref 0.3–1.2)
Total Protein: 7.8 g/dL (ref 6.5–8.1)

## 2020-07-23 LAB — CBC WITH DIFFERENTIAL/PLATELET
Abs Immature Granulocytes: 0.02 10*3/uL (ref 0.00–0.07)
Basophils Absolute: 0.1 10*3/uL (ref 0.0–0.1)
Basophils Relative: 1 %
Eosinophils Absolute: 0.1 10*3/uL (ref 0.0–0.5)
Eosinophils Relative: 3 %
HCT: 42.6 % (ref 36.0–46.0)
Hemoglobin: 12.6 g/dL (ref 12.0–15.0)
Immature Granulocytes: 0 %
Lymphocytes Relative: 41 %
Lymphs Abs: 2.3 10*3/uL (ref 0.7–4.0)
MCH: 26 pg (ref 26.0–34.0)
MCHC: 29.6 g/dL — ABNORMAL LOW (ref 30.0–36.0)
MCV: 87.8 fL (ref 80.0–100.0)
Monocytes Absolute: 0.5 10*3/uL (ref 0.1–1.0)
Monocytes Relative: 9 %
Neutro Abs: 2.6 10*3/uL (ref 1.7–7.7)
Neutrophils Relative %: 46 %
Platelets: 281 10*3/uL (ref 150–400)
RBC: 4.85 MIL/uL (ref 3.87–5.11)
RDW: 13.1 % (ref 11.5–15.5)
WBC: 5.6 10*3/uL (ref 4.0–10.5)
nRBC: 0 % (ref 0.0–0.2)

## 2020-07-23 LAB — POCT URINALYSIS DIPSTICK, ED / UC
Bilirubin Urine: NEGATIVE
Glucose, UA: NEGATIVE mg/dL
Hgb urine dipstick: NEGATIVE
Ketones, ur: NEGATIVE mg/dL
Leukocytes,Ua: NEGATIVE
Nitrite: NEGATIVE
Protein, ur: NEGATIVE mg/dL
Specific Gravity, Urine: 1.01 (ref 1.005–1.030)
Urobilinogen, UA: 0.2 mg/dL (ref 0.0–1.0)
pH: 6 (ref 5.0–8.0)

## 2020-07-23 MED ORDER — KETOROLAC TROMETHAMINE 60 MG/2ML IM SOLN
60.0000 mg | Freq: Once | INTRAMUSCULAR | Status: AC
  Administered 2020-07-23: 60 mg via INTRAMUSCULAR

## 2020-07-23 MED ORDER — KETOROLAC TROMETHAMINE 60 MG/2ML IM SOLN
INTRAMUSCULAR | Status: AC
  Filled 2020-07-23: qty 2

## 2020-07-23 NOTE — ED Provider Notes (Signed)
[place]    CSN: [id number] Arrival date & time: 07/23/20  1749      History   Chief Complaint Chief Complaint  Patient presents with  . Flank Pain  . Arm Pain    HPI Alexandria Mack is a 59 y.o. female.   Here today with about a month of dull, intermittent left flank pain. States the pain does not seem to be triggered by movement, possible triggered by being dehydrated. Denies dysuria, hematuria, frequency, vaginal sxs, injury to area, skin changes. Trying increased fluids, cranberry sometimes with relief. Also having intermittent left arm pains that started after her COVID vaccine a month ago. Does not happen often, sometimes with movement. Has not tried anything for this. No numbness, weakness, swelling in the arm.      Past Medical History:  Diagnosis Date  . Anxiety 2012   managed with medications   . Chronic pain syndrome   . Depression 2012  . Diverticulitis   . External hemorrhoid   . Fibroid 2010   uterine fibroids  . Fibromyalgia 2010  . Hemorrhoids, internal   . Hypertension 2004  . Morbid obesity ([place])   . OSA (obstructive sleep apnea) 2013   no C Pap    Patient Active Problem List   Diagnosis Date Noted  . Mediastinal adenopathy 07/24/2019  . External hemorrhoid   . Hemorrhoids, internal   . Uterine fibroid 04/25/2013    Past Surgical History:  Procedure Laterality Date  . APPENDECTOMY  age 71  . COLONOSCOPY  2004  . TUBAL LIGATION Bilateral 1996  . VAGINAL DELIVERY     x3    OB History    Gravida  3   Para  3   Term      Preterm      AB      Living  3     SAB      IAB      Ectopic      Multiple      Live Births               Home Medications    Prior to Admission medications   Medication Sig Start Date End Date Taking? Authorizing Provider  amLODipine (NORVASC) 10 MG tablet TAKE 1 TABLET BY MOUTH EVERY DAY IN THE EVENING 07/16/20   Adrian Prows, MD  nebivolol (BYSTOLIC) 10 MG tablet Take 1  tablet (10 mg total) by mouth daily. 01/07/20   Adrian Prows, MD  rosuvastatin (CRESTOR) 5 MG tablet Take 1 tablet (5 mg total) by mouth daily. 06/11/20   Adrian Prows, MD  spironolactone (ALDACTONE) 25 MG tablet TAKE 1 TABLET BY MOUTH EVERY DAY IN THE MORNING 06/15/20   Adrian Prows, MD    Family History Family History  Problem Relation Age of Onset  . Hypertension Father   . Alzheimer's disease Father   . Heart disease Father   . Diabetes Father   . Mental illness Mother   . Diabetes Mother   . Breast cancer Sister 67       chemo, mastectomy on left  . Diabetes Sister   . Hypertension Sister   . Hypertension Brother   . Hypertension Brother   . Hypertension Brother     Social History Social History   Tobacco Use  . Smoking status: Never Smoker  . Smokeless tobacco: Never Used  Vaping Use  . Vaping Use: Never used  Substance Use Topics  . Alcohol use: Not Currently  .  Drug use: Not Currently    Types: Marijuana    Comment: tried for treatment of fibromyalgia - stopped secondaryy to burning in mouth     Allergies   Codeine   Review of Systems Review of Systems PER HPI   Physical Exam Triage Vital Signs ED Triage Vitals  Enc Vitals Group     BP 07/23/20 1857 137/77     Pulse Rate 07/23/20 1857 (!) 50     Resp 07/23/20 1857 17     Temp 07/23/20 1857 97.9 F (36.6 C)     Temp Source 07/23/20 1857 Oral     SpO2 07/23/20 1857 100 %     Weight --      Height --      Head Circumference --      Peak Flow --      Pain Score 07/23/20 1854 3     Pain Loc --      Pain Edu? --      Excl. in [place]? --    No data found.  Updated Vital Signs BP 137/77 (BP Location: Right Arm)   Pulse (!) 50   Temp 97.9 F (36.6 C) (Oral)   Resp 17   LMP 08/16/2007 (Approximate)   SpO2 100%   Visual Acuity Right Eye Distance:   Left Eye Distance:   Bilateral Distance:    Right Eye Near:   Left Eye Near:    Bilateral Near:     Physical Exam Vitals and nursing note reviewed.   Constitutional:      Appearance: Normal appearance. She is not ill-appearing.  HENT:     Head: Atraumatic.  Eyes:     Extraocular Movements: Extraocular movements intact.     Conjunctiva/sclera: Conjunctivae normal.  Cardiovascular:     Rate and Rhythm: Normal rate and regular rhythm.     Heart sounds: Normal heart sounds.  Pulmonary:     Effort: Pulmonary effort is normal.     Breath sounds: Normal breath sounds.  Abdominal:     General: Bowel sounds are normal. There is no distension.     Palpations: Abdomen is soft.     Tenderness: There is no abdominal tenderness. There is no right CVA tenderness, left CVA tenderness or guarding.  Musculoskeletal:        General: Normal range of motion.     Cervical back: Normal range of motion and neck supple.     Comments: Pain reproducible with passive ROM exercise to left shoulder No left flank ttp, no reproduction of pain with ROM exercises  Skin:    General: Skin is warm and dry.     Findings: No erythema or rash.  Neurological:     Mental Status: She is alert and oriented to person, place, and time.  Psychiatric:        Mood and Affect: Mood normal.        Thought Content: Thought content normal.        Judgment: Judgment normal.      UC Treatments / Results  Labs (all labs ordered are listed, but only abnormal results are displayed) Labs Reviewed  CBC WITH DIFFERENTIAL/PLATELET - Abnormal; Notable for the following components:      Result Value   MCHC 29.6 (*)    All other components within normal limits  COMPREHENSIVE METABOLIC PANEL  POCT URINALYSIS DIPSTICK, ED / UC    EKG   Radiology No results found.  Procedures Procedures (including critical care time)  Medications  Ordered in UC Medications  ketorolac (TORADOL) injection 60 mg (60 mg Intramuscular Given 07/23/20 1954)    Initial Impression / Assessment and Plan / UC Course  I have reviewed the triage vital signs and the nursing notes.  Pertinent labs  & imaging results that were available during my care of the patient were reviewed by me and considered in my medical decision making (see chart for details).     U/A benign today, vital signs stable, exam very reassuring. Will obtain basic labs for further r/o but reassurance given that neither issue appears to warrant urgent further workup or management and can be followed up with PCP if persistent. IM toradol given to help with pain, inflammation. Return precautions reviewed for worsening sxs.  Final Clinical Impressions(s) / UC Diagnoses   Final diagnoses:  Flank pain  Left arm pain   Discharge Instructions   None    ED Prescriptions    None     PDMP not reviewed this encounter.   Volney American, [place] 07/23/20 2059

## 2020-07-23 NOTE — ED Triage Notes (Signed)
Pt presents with left side pain and left arm pain xs 4 weeks that comes and goes. States has been drinking lots of water and cranberry juice. States received last COVID vaccine 06/22/20 and states arm has been bothering her since.

## 2020-08-27 DIAGNOSIS — K5730 Diverticulosis of large intestine without perforation or abscess without bleeding: Secondary | ICD-10-CM | POA: Diagnosis not present

## 2020-08-27 DIAGNOSIS — R140 Abdominal distension (gaseous): Secondary | ICD-10-CM | POA: Diagnosis not present

## 2020-08-27 DIAGNOSIS — K5904 Chronic idiopathic constipation: Secondary | ICD-10-CM | POA: Diagnosis not present

## 2020-08-27 DIAGNOSIS — M549 Dorsalgia, unspecified: Secondary | ICD-10-CM | POA: Diagnosis not present

## 2020-10-03 DIAGNOSIS — Z1231 Encounter for screening mammogram for malignant neoplasm of breast: Secondary | ICD-10-CM | POA: Diagnosis not present

## 2020-10-29 ENCOUNTER — Ambulatory Visit: Payer: BC Managed Care – PPO

## 2020-10-29 ENCOUNTER — Encounter: Payer: Self-pay | Admitting: Sports Medicine

## 2020-10-29 ENCOUNTER — Other Ambulatory Visit: Payer: Self-pay

## 2020-10-29 ENCOUNTER — Ambulatory Visit (INDEPENDENT_AMBULATORY_CARE_PROVIDER_SITE_OTHER): Payer: BC Managed Care – PPO | Admitting: Sports Medicine

## 2020-10-29 DIAGNOSIS — M722 Plantar fascial fibromatosis: Secondary | ICD-10-CM

## 2020-10-29 DIAGNOSIS — K601 Chronic anal fissure: Secondary | ICD-10-CM | POA: Insufficient documentation

## 2020-10-29 DIAGNOSIS — K625 Hemorrhage of anus and rectum: Secondary | ICD-10-CM | POA: Insufficient documentation

## 2020-10-29 DIAGNOSIS — M79671 Pain in right foot: Secondary | ICD-10-CM | POA: Diagnosis not present

## 2020-10-29 DIAGNOSIS — K5900 Constipation, unspecified: Secondary | ICD-10-CM | POA: Insufficient documentation

## 2020-10-29 DIAGNOSIS — M779 Enthesopathy, unspecified: Secondary | ICD-10-CM

## 2020-10-29 DIAGNOSIS — Z1211 Encounter for screening for malignant neoplasm of colon: Secondary | ICD-10-CM | POA: Insufficient documentation

## 2020-10-29 DIAGNOSIS — K5904 Chronic idiopathic constipation: Secondary | ICD-10-CM | POA: Insufficient documentation

## 2020-10-29 DIAGNOSIS — K5730 Diverticulosis of large intestine without perforation or abscess without bleeding: Secondary | ICD-10-CM | POA: Insufficient documentation

## 2020-10-29 DIAGNOSIS — M2141 Flat foot [pes planus] (acquired), right foot: Secondary | ICD-10-CM | POA: Diagnosis not present

## 2020-10-29 DIAGNOSIS — M79672 Pain in left foot: Secondary | ICD-10-CM

## 2020-10-29 DIAGNOSIS — R933 Abnormal findings on diagnostic imaging of other parts of digestive tract: Secondary | ICD-10-CM | POA: Insufficient documentation

## 2020-10-29 DIAGNOSIS — R079 Chest pain, unspecified: Secondary | ICD-10-CM | POA: Insufficient documentation

## 2020-10-29 DIAGNOSIS — R141 Gas pain: Secondary | ICD-10-CM | POA: Insufficient documentation

## 2020-10-29 DIAGNOSIS — M2142 Flat foot [pes planus] (acquired), left foot: Secondary | ICD-10-CM

## 2020-10-29 NOTE — Progress Notes (Signed)
Subjective: Alexandria Mack is a 60 y.o. female patient presents to office with complaint of moderate heel pain on the right.  Patient admits to post static dyskinesia for a few months since October reports that she was out of town walking in the park over [place] and after her walk the next day had pain and the pain has continued to progress in the right heel and has not gotten better reports that sometimes the pain can be 8-10 out of 10 has tried changing shoes and gentle stretching without relief.  Patient denies any trauma or injury.  Admits to a past history of a broken ankle on the left 20 years ago and a history of a broken foot on the right in 2017.  Review of system noncontributory. Patient Active Problem List   Diagnosis Date Noted  . Abnormal finding on GI tract imaging 10/29/2020  . Chest pain 10/29/2020  . 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 Chronic idiopathic constipation 10/29/2020  . Colon cancer screening 10/29/2020  . Constipation 10/29/2020  . 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 Flatulence, eructation and gas pain 10/29/2020  . Morbid obesity ([place]) 10/29/2020  . Rectal bleeding 10/29/2020  . Mediastinal adenopathy 07/24/2019  . External hemorrhoid   . Hemorrhoids, internal   . Uterine fibroid 04/25/2013    Current Outpatient Medications on File Prior to Visit  Medication Sig Dispense Refill  . amLODipine (NORVASC) 10 MG tablet TAKE 1 TABLET BY MOUTH EVERY DAY IN THE EVENING 90 tablet 3  . cyclobenzaprine (FLEXERIL) 10 MG tablet 1 tab(s)    . nebivolol (BYSTOLIC) 10 MG tablet Take 1 tablet (10 mg total) by mouth daily. 90 tablet 3  . rosuvastatin (CRESTOR) 5 MG tablet Take 1 tablet (5 mg total) by mouth daily. 90 tablet 3  . spironolactone (ALDACTONE) 25 MG tablet TAKE 1 TABLET BY MOUTH EVERY DAY IN THE MORNING 90 tablet 1   No current facility-administered medications on file prior to visit.    Allergies  Allergen Reactions  . Codeine Nausea And  Vomiting    Objective: Physical Exam General: The patient is alert and oriented x3 in no acute distress.  Dermatology: Skin is warm, dry and supple bilateral lower extremities. Nails 1-10 are normal. There is no erythema, edema, no eccymosis, no open lesions present. Integument is otherwise unremarkable.  Vascular: Dorsalis Pedis pulse and Posterior Tibial pulse are 2/4 bilateral. Capillary fill time is immediate to all digits.  Neurological: Grossly intact to light touch bilateral.  Musculoskeletal: Tenderness to palpation at the medial calcaneal tubercale and through the insertion of the plantar fascia on the Right foot as well as tenderness along the posterior tibial course on the right. No pain with calf compression bilateral. There is decreased Ankle joint range of motion bilateral. All other joints range of motion within normal limits bilateral. Strength 5/5 in all groups bilateral.    Gait: Unassisted, Antalgic avoid weight on Right heel  Assessment and Plan: Problem List Items Addressed This Visit   None   Visit Diagnoses    Pain of right heel    -  Primary   Plantar fasciitis of right foot       Tendonitis       Pes planus of both feet          -Complete examination performed.  -Xray machine was not operating at this time -Discussed with patient in detail the condition of plantar fasciitis and tendinitis, how this occurs and general treatment  options.  -Patient declined a prescription for oral steroid at this time -Prescribed meloxicam for patient to take as instructed -Recommended good supportive shoes and Fascial brace for the right which was dispensed at today's visit. -Explained and dispensed to patient daily stretching exercises. -Recommend patient to ice affected area 1-2x daily. -Patient to return to office in 4-5 weeks for follow up or sooner if problems or questions arise.  If patient still has symptoms we will x-ray at next visit since x-ray was down this  visit.  Landis Martins, DPM

## 2020-11-12 IMAGING — DX DG CHEST 1V PORT
1 series · 1 of 1 positions shown · non-contrast
Comparison: September 04, 2018

CLINICAL DATA: Pain and shortness of breath

EXAM:
PORTABLE CHEST 1 VIEW

[chest ap]
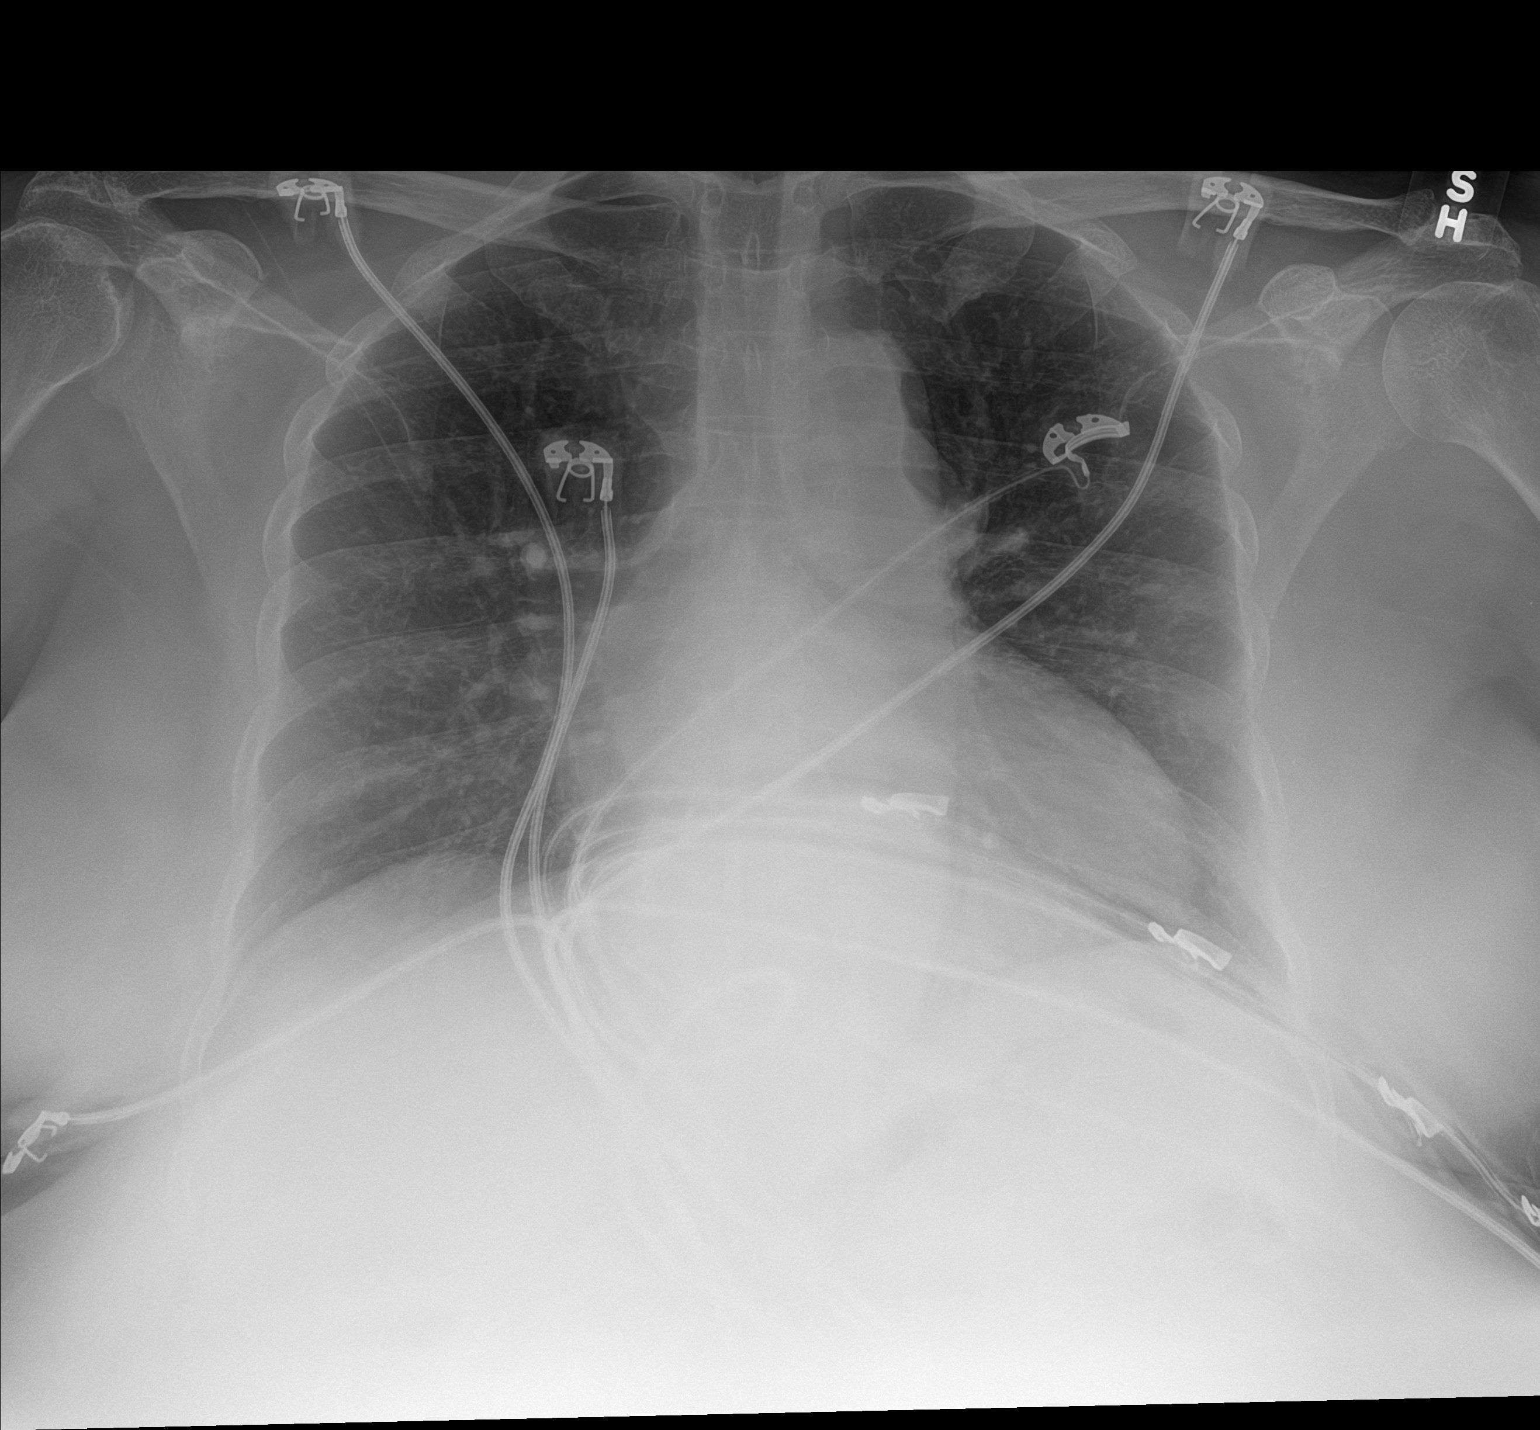

[1 of 1 positions shown; findings below may reference images not displayed]

FINDINGS: The lungs are clear. Heart is mildly enlarged with pulmonary
vascularity normal. No adenopathy. No pneumothorax. No bone lesions.
IMPRESSION: Cardiomegaly.  No edema or consolidation.

## 2020-11-16 DIAGNOSIS — M549 Dorsalgia, unspecified: Secondary | ICD-10-CM | POA: Diagnosis not present

## 2020-11-16 DIAGNOSIS — M25512 Pain in left shoulder: Secondary | ICD-10-CM | POA: Diagnosis not present

## 2020-11-16 DIAGNOSIS — G8929 Other chronic pain: Secondary | ICD-10-CM | POA: Diagnosis not present

## 2020-11-16 DIAGNOSIS — R7303 Prediabetes: Secondary | ICD-10-CM | POA: Diagnosis not present

## 2020-11-16 DIAGNOSIS — I10 Essential (primary) hypertension: Secondary | ICD-10-CM | POA: Diagnosis not present

## 2020-11-20 IMAGING — US US ABDOMEN LIMITED
1 series · 14 of 25 positions shown · non-contrast
Comparison: CT angio chest abdomen pelvis 07/18/2019

CLINICAL DATA: RIGHT upper quadrant pain, abnormal gallbladder on
CT angio exam

EXAM:
ULTRASOUND ABDOMEN LIMITED RIGHT UPPER QUADRANT

[Series 1: us abdomen limited · 0.15mm/px · 14 of 45 slices shown]
[im 1/45]
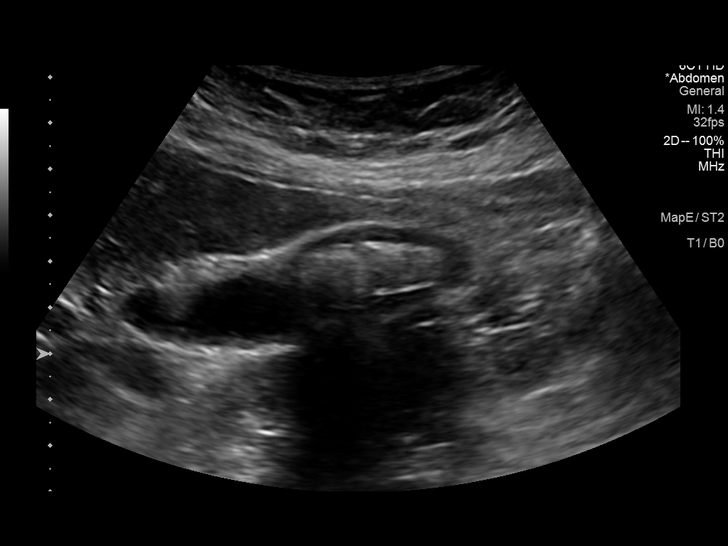
[im 4/45]
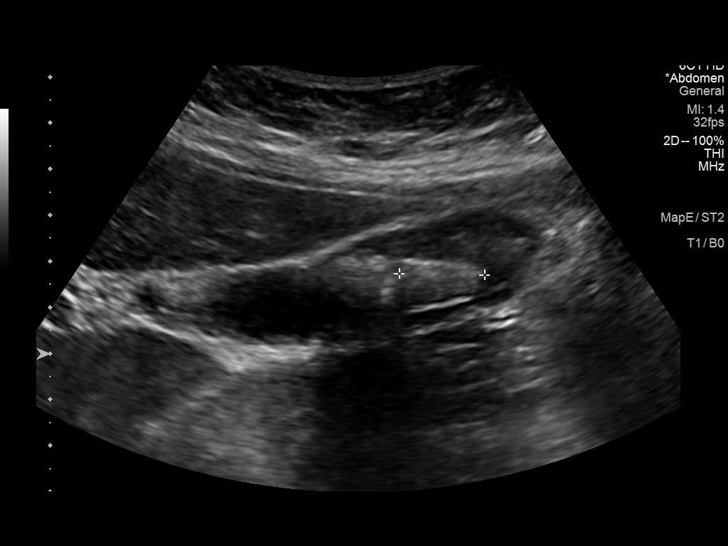
[im 8/45]
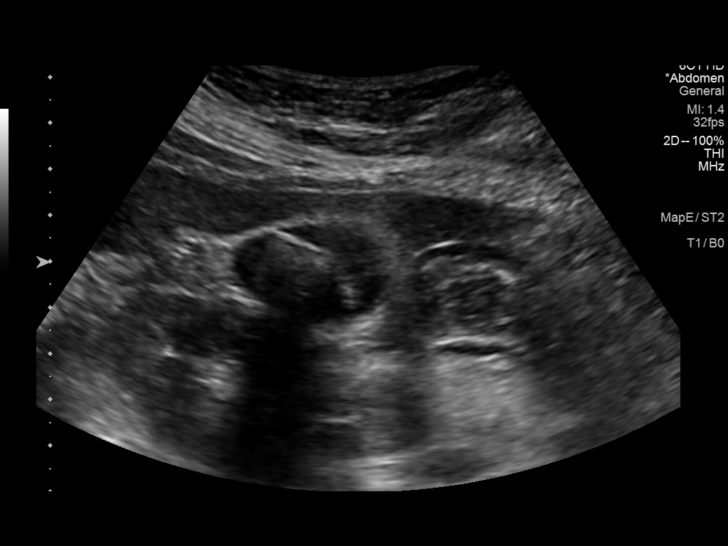
[im 12/45]
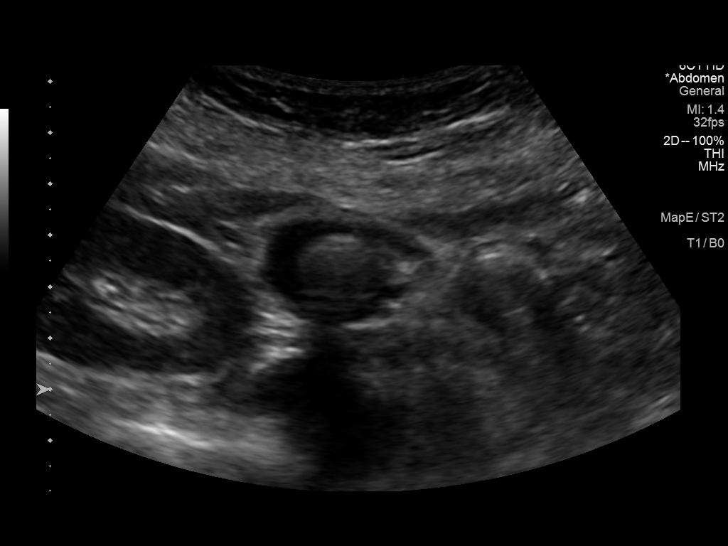
[im 15/45]
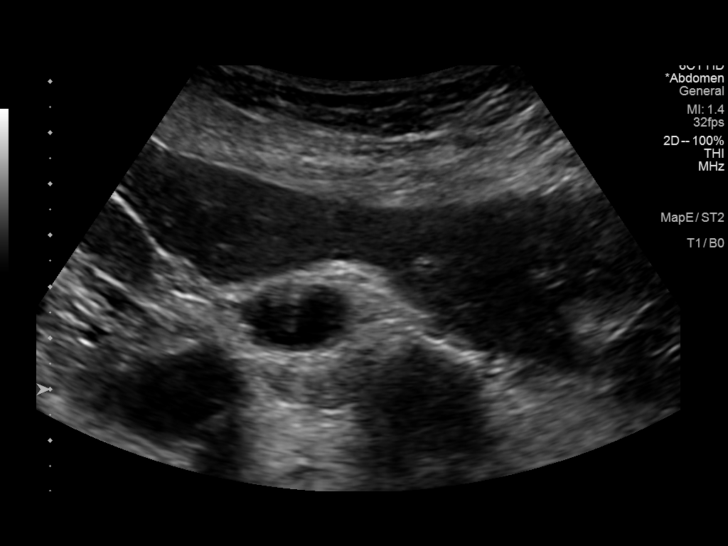
[im 17/45]
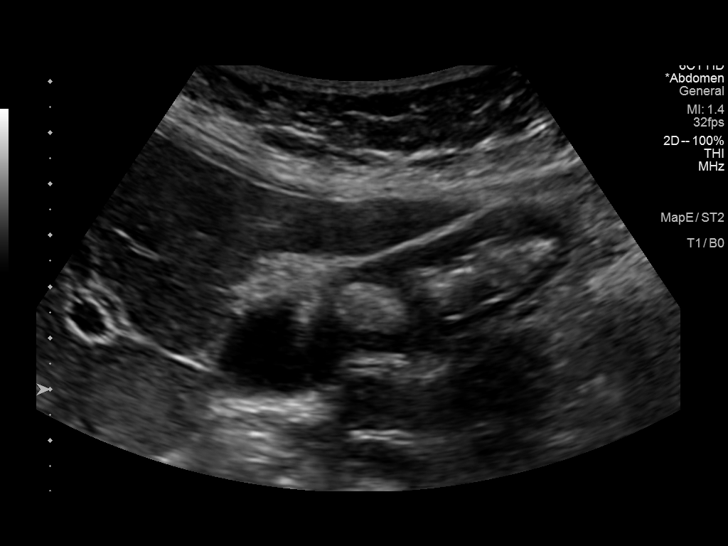
[im 21/45]
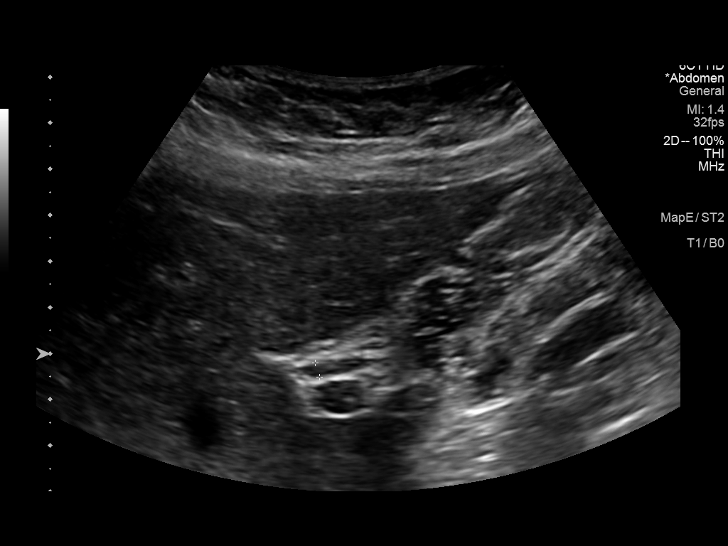
[im 24/45]
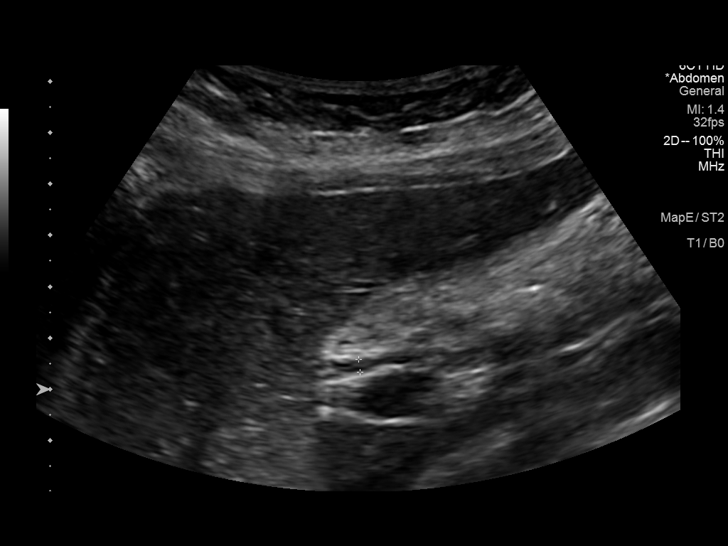
[im 28/45]
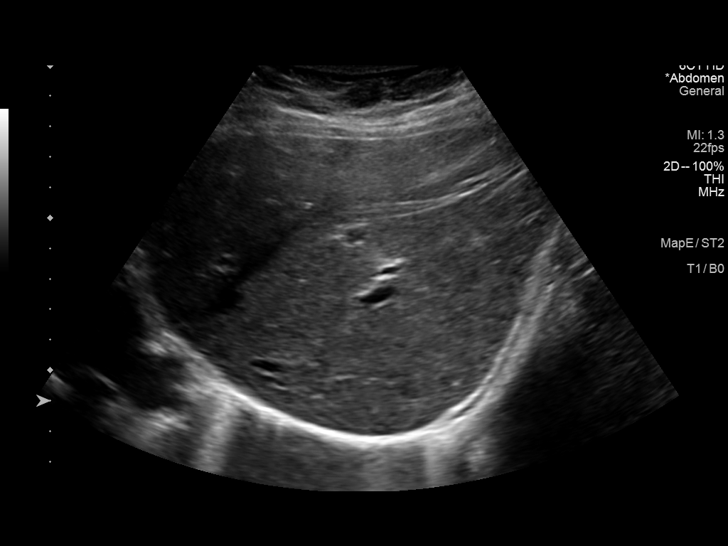
[im 30/45]
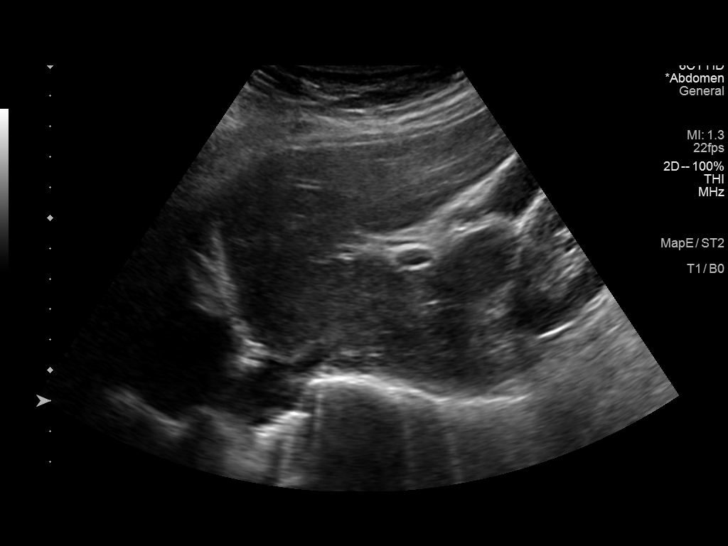
[im 34/45]
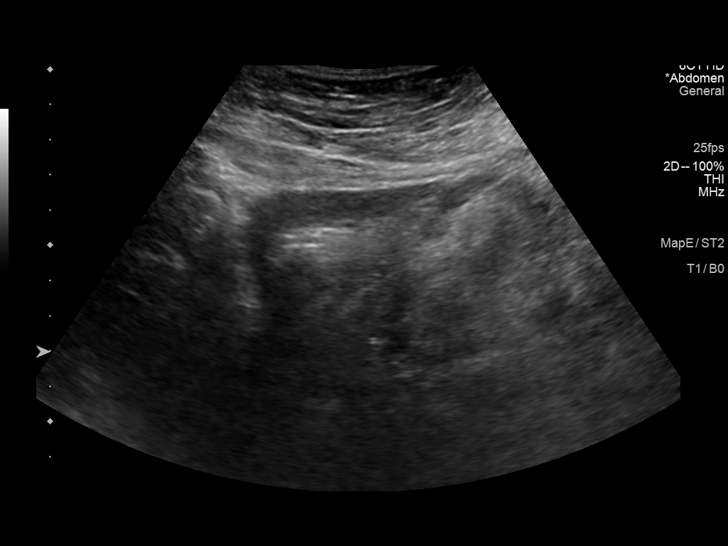
[im 37/45]
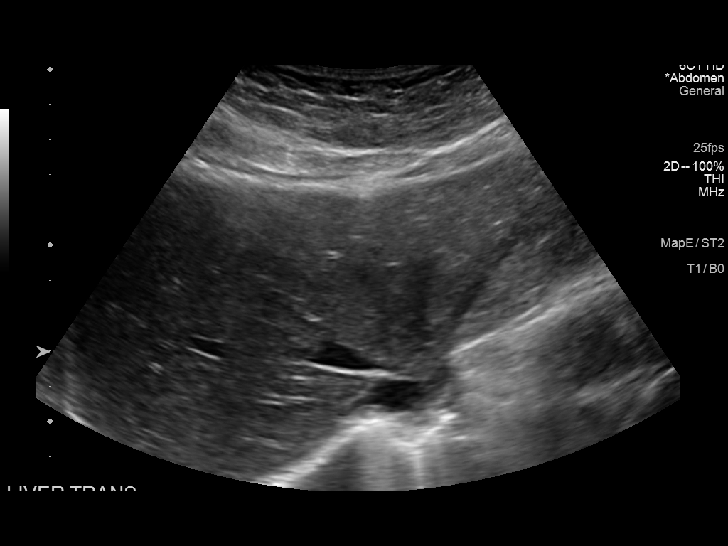
[im 41/45]
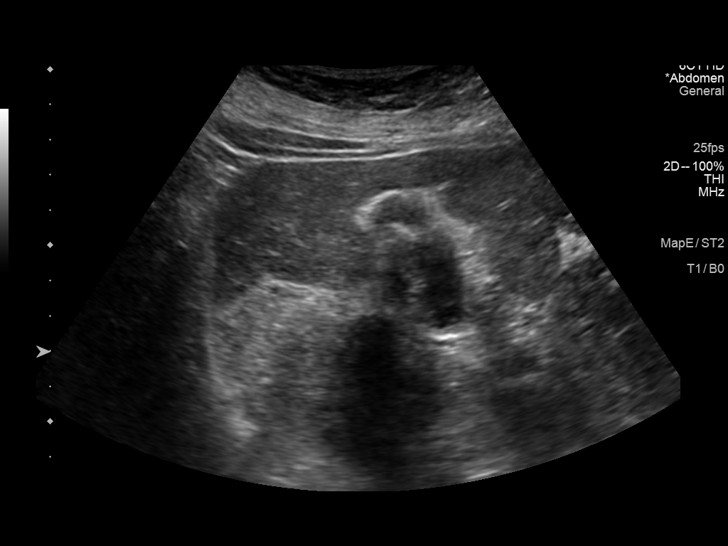
[im 45/45]
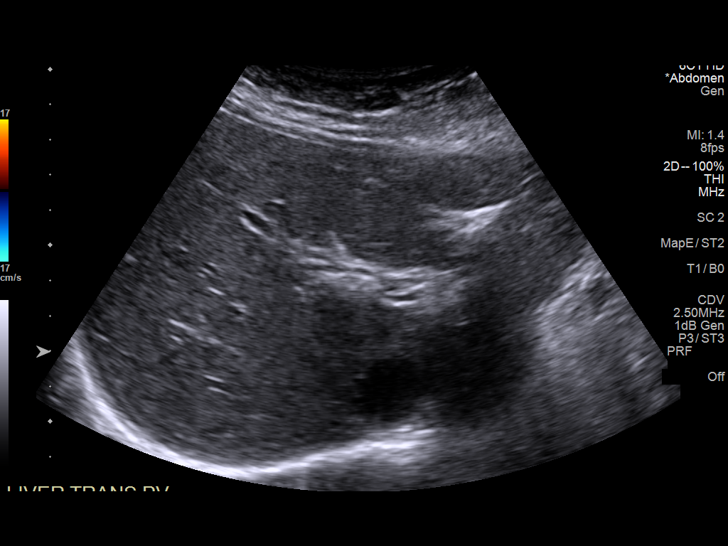

[14 of 25 positions shown; findings below may reference images not displayed]

FINDINGS: Gallbladder:

Calculi within gallbladder up to 19 mm diameter. Mild gallbladder
wall thickening. No pericholecystic fluid or sonographic Murphy
sign.

Common bile duct:

Diameter: 3 mm, normal

Liver:

Normal echogenicity without mass or nodularity. Portal vein is
patent on color Doppler imaging with normal direction of blood flow
towards the liver.

Other: No RIGHT upper quadrant free fluid.
IMPRESSION: Cholelithiasis with mild gallbladder wall thickening but no
pericholecystic fluid or sonographic Murphy sign with suggest acute
cholecystitis.

Remainder of exam unremarkable.

## 2020-12-10 ENCOUNTER — Ambulatory Visit: Payer: BC Managed Care – PPO | Admitting: Sports Medicine

## 2020-12-12 ENCOUNTER — Other Ambulatory Visit: Payer: Self-pay | Admitting: Cardiology

## 2020-12-12 DIAGNOSIS — I10 Essential (primary) hypertension: Secondary | ICD-10-CM

## 2020-12-29 DIAGNOSIS — D225 Melanocytic nevi of trunk: Secondary | ICD-10-CM | POA: Diagnosis not present

## 2020-12-29 DIAGNOSIS — D2271 Melanocytic nevi of right lower limb, including hip: Secondary | ICD-10-CM | POA: Diagnosis not present

## 2020-12-29 DIAGNOSIS — M549 Dorsalgia, unspecified: Secondary | ICD-10-CM | POA: Diagnosis not present

## 2020-12-29 DIAGNOSIS — D2261 Melanocytic nevi of right upper limb, including shoulder: Secondary | ICD-10-CM | POA: Diagnosis not present

## 2020-12-29 DIAGNOSIS — M25512 Pain in left shoulder: Secondary | ICD-10-CM | POA: Diagnosis not present

## 2020-12-29 DIAGNOSIS — G8929 Other chronic pain: Secondary | ICD-10-CM | POA: Diagnosis not present

## 2020-12-29 DIAGNOSIS — D2262 Melanocytic nevi of left upper limb, including shoulder: Secondary | ICD-10-CM | POA: Diagnosis not present

## 2021-01-27 DIAGNOSIS — Z01419 Encounter for gynecological examination (general) (routine) without abnormal findings: Secondary | ICD-10-CM | POA: Diagnosis not present

## 2021-02-02 ENCOUNTER — Encounter (INDEPENDENT_AMBULATORY_CARE_PROVIDER_SITE_OTHER): Payer: Self-pay | Admitting: Family Medicine

## 2021-02-02 ENCOUNTER — Other Ambulatory Visit: Payer: Self-pay

## 2021-02-02 ENCOUNTER — Ambulatory Visit (INDEPENDENT_AMBULATORY_CARE_PROVIDER_SITE_OTHER): Payer: BC Managed Care – PPO | Admitting: Family Medicine

## 2021-02-02 VITALS — BP 132/85 | HR 45 | Temp 98.2°F | Ht 59.0 in | Wt 194.0 lb

## 2021-02-02 DIAGNOSIS — I10 Essential (primary) hypertension: Secondary | ICD-10-CM | POA: Diagnosis not present

## 2021-02-02 DIAGNOSIS — R0602 Shortness of breath: Secondary | ICD-10-CM

## 2021-02-02 DIAGNOSIS — R5383 Other fatigue: Secondary | ICD-10-CM | POA: Diagnosis not present

## 2021-02-02 DIAGNOSIS — Z6839 Body mass index (BMI) 39.0-39.9, adult: Secondary | ICD-10-CM

## 2021-02-02 DIAGNOSIS — Z9189 Other specified personal risk factors, not elsewhere classified: Secondary | ICD-10-CM

## 2021-02-02 DIAGNOSIS — Z1331 Encounter for screening for depression: Secondary | ICD-10-CM | POA: Diagnosis not present

## 2021-02-02 DIAGNOSIS — E782 Mixed hyperlipidemia: Secondary | ICD-10-CM

## 2021-02-02 DIAGNOSIS — R7303 Prediabetes: Secondary | ICD-10-CM | POA: Diagnosis not present

## 2021-02-02 DIAGNOSIS — G4733 Obstructive sleep apnea (adult) (pediatric): Secondary | ICD-10-CM

## 2021-02-02 DIAGNOSIS — Z0289 Encounter for other administrative examinations: Secondary | ICD-10-CM

## 2021-02-03 LAB — COMPREHENSIVE METABOLIC PANEL
ALT: 12 IU/L (ref 0–32)
AST: 16 IU/L (ref 0–40)
Albumin/Globulin Ratio: 1.7 (ref 1.2–2.2)
Albumin: 4.7 g/dL (ref 3.8–4.9)
Alkaline Phosphatase: 87 IU/L (ref 44–121)
BUN/Creatinine Ratio: 22 (ref 9–23)
BUN: 19 mg/dL (ref 6–24)
Bilirubin Total: 0.6 mg/dL (ref 0.0–1.2)
CO2: 24 mmol/L (ref 20–29)
Calcium: 10 mg/dL (ref 8.7–10.2)
Chloride: 103 mmol/L (ref 96–106)
Creatinine, Ser: 0.85 mg/dL (ref 0.57–1.00)
Globulin, Total: 2.8 g/dL (ref 1.5–4.5)
Glucose: 97 mg/dL (ref 65–99)
Potassium: 4.5 mmol/L (ref 3.5–5.2)
Sodium: 142 mmol/L (ref 134–144)
Total Protein: 7.5 g/dL (ref 6.0–8.5)
eGFR: 79 mL/min/{1.73_m2} (ref >59)

## 2021-02-03 LAB — LIPID PANEL WITH LDL/HDL RATIO
Cholesterol, Total: 177 mg/dL (ref 100–199)
HDL: 75 mg/dL (ref >39)
LDL Chol Calc (NIH): 86 mg/dL (ref 0–99)
LDL/HDL Ratio: 1.1 ratio (ref 0.0–3.2)
Triglycerides: 88 mg/dL (ref 0–149)
VLDL Cholesterol Cal: 16 mg/dL (ref 5–40)

## 2021-02-03 LAB — VITAMIN B12: Vitamin B-12: 706 pg/mL (ref 232–1245)

## 2021-02-03 LAB — T3: T3, Total: 133 ng/dL (ref 71–180)

## 2021-02-03 LAB — VITAMIN D 25 HYDROXY (VIT D DEFICIENCY, FRACTURES): Vit D, 25-Hydroxy: 37.5 ng/mL (ref 30.0–100.0)

## 2021-02-03 LAB — T4: T4, Total: 8.3 ug/dL (ref 4.5–12.0)

## 2021-02-03 LAB — TSH: TSH: 1.54 u[IU]/mL (ref 0.450–4.500)

## 2021-02-03 LAB — INSULIN, RANDOM: INSULIN: 12.6 u[IU]/mL (ref 2.6–24.9)

## 2021-02-03 LAB — FOLATE: Folate: 6.5 ng/mL (ref >3.0)

## 2021-02-03 NOTE — Progress Notes (Signed)
Chief Complaint:   OBESITY Alexandria [facility] (MR# [id number]) is a 60 y.o. female who presents for evaluation and treatment of obesity and related comorbidities. Current BMI is Body mass index is 39.18 kg/m. Alexandria has been struggling with her weight for many years and has been unsuccessful in either losing weight, maintaining weight loss, or reaching her healthy weight goal.  Alexandria Mack is currently in the action stage of change and ready to dedicate time achieving and maintaining a healthier weight. Alexandria Mack is interested in becoming our patient and working on intensive lifestyle modifications including (but not limited to) diet and exercise for weight loss.  Alexandria Mack was referred by a current patient. 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 Lives at home with Alexandria [facility] and [facility]. Would like to be vegetarian + vegan. Coffee in AM with 2 individual cups creamer + Splenda/honey; Protein shake (Purelife) + 2 scrambled eggs + cheese + [place] sausage (1/2 cup) (feels satisfied); Lunch- salads in pre-portioned container- lettuce, tomato, beets, olives, cucumber, and ranch dressing + vinaigrette with water or Kombucha (1/2 bottle) (feels satisfied); ~ 3 PM- protein shake + wheat crackers with PB (1tsp) (feel hungry); Dinner- cabbage + carrots or spinach + onions or baked fish 6-8 oz + green beans; No food afterwards.  [name] habits were reviewed today and are as follows: Her family eats meals together, her desired weight loss is 59 lbs, she started gaining weight after second child and when dealing with infidelity, her heaviest weight ever was 212 pounds, she is trying to follow a vegetarian diet, she is trying to follow a vegan diet, she is frequently drinking liquids with calories, she frequently eats larger portions than normal, she has binge eating behaviors, and she struggles with emotional eating.  Depression Screen [name]'s Food and Mood (modified PHQ-9) score was  9.  Depression screen Alexandria Mack 2/9 02/02/2021  Decreased Interest 2  Down, Depressed, Hopeless 1  PHQ - 2 Score 3  Altered sleeping 1  Tired, decreased energy 1  Change in appetite 3  Feeling bad or failure about yourself  1  Trouble concentrating 0  Moving slowly or fidgety/restless 0  Suicidal thoughts 0  PHQ-9 Score 9  Difficult doing work/chores Not difficult at all   Subjective:   1. Other fatigue Alexandria Mack denies daytime somnolence and admits to waking up still tired. Patent has a history of symptoms of morning fatigue. [name] generally gets 5 or 6 hours of sleep per night, and states that she has poor sleep quality. Snoring is present. Apneic episodes are present. Epworth Sleepiness Score is 1. EKG sinus rhythm - bradycardia at 49 bpm.  2. Shortness of breath on exertion [name] notes increasing shortness of breath with exercising and seems to be worsening over time with weight gain. She notes getting out of breath sooner with activity than she used to. This has gotten worse recently. [name] denies shortness of breath at rest or orthopnea. EKG sinus rhythm - bradycardia at 49 bpm.  3. Pre-diabetes Alexandria Mack's A1c was 6.0 on 11/16/2020. She has a history of gestational diabetes.  4. Primary hypertension Alexandria Mack is on nebivolol, amlodipine and spironolactone. She was diagnosed over 10 years ago.  5. Mixed hyperlipidemia Alexandria Mack was previously on statin therapy. She had an LDL of 53 on 12/13/2019. She reported myalgias on statin, so medication was discontinued.  6. OSA (obstructive sleep apnea) Alexandria Mack does not have a CPAP (recommended but financial constraints at that time). She is not sure if she's interested  in  CPAP (states some brands cause cancer).  7. At risk for diabetes mellitus [name] is at higher than average risk for developing diabetes due to obesity.   Assessment/Plan:   1. Other fatigue [name] does feel that her weight is causing her energy to be lower  than it should be. Fatigue may be related to obesity, depression or many other causes. Labs will be ordered, and in the meanwhile, [name] will focus on self care including making healthy food choices, increasing physical activity and focusing on stress reduction. Pt is to follow up with Dr. Einar Gip for marked bradycardia. Check labs today.  - Vitamin B12 - EKG 12-Lead - Folate - T3 - T4 - TSH - VITAMIN D 25 Hydroxy (Vit-D Deficiency, Fractures)  2. Shortness of breath on exertion Alexandria Mack does feel that she gets out of breath more easily that she used to when she exercises. [name]'s shortness of breath appears to be obesity related and exercise induced. She has agreed to work on weight loss and gradually increase exercise to treat her exercise induced shortness of breath. Will continue to monitor closely. Pt is to follow up with Dr. Einar Gip for marked bradycardia.  3. Pre-diabetes [name] will continue to work on weight loss, exercise, and decreasing simple carbohydrates to help decrease the risk of diabetes.  Check labs today.  - Insulin, random  4. Primary hypertension [name] is working on healthy weight loss and exercise to improve blood pressure control. We will watch for signs of hypotension as she continues her lifestyle modifications. Check labs today.  - Comprehensive metabolic panel  5. Mixed hyperlipidemia Cardiovascular risk and specific lipid/LDL goals reviewed.  We discussed several lifestyle modifications today and [name] will continue to work on diet, exercise and weight loss efforts. Orders and follow up as documented in patient record.  Check labs today.  Counseling Intensive lifestyle modifications are the first line treatment for this issue. Dietary changes: Increase soluble fiber. Decrease simple carbohydrates. Exercise changes: Moderate to vigorous-intensity aerobic activity 150 minutes per week if tolerated. Lipid-lowering medications: see documented in  medical record.  - Lipid Panel With LDL/HDL Ratio  6. OSA (obstructive sleep apnea) Intensive lifestyle modifications are the first line treatment for this issue. We discussed several lifestyle modifications today and she will continue to work on diet, exercise and weight loss efforts. We will continue to monitor. Orders and follow up as documented in patient record. Follow up at next appt.  7. Screening for depression Alexandria Mack had a positive depression screening. Depression is commonly associated with obesity and often results in emotional eating behaviors. We will monitor this closely and work on CBT to help improve the non-hunger eating patterns. Referral to Psychology may be required if no improvement is seen as she continues in our clinic.  8. At risk for diabetes mellitus Alexandria Mack was given approximately 15 minutes of diabetes education and counseling today. We discussed intensive lifestyle modifications today with an emphasis on weight loss as well as increasing exercise and decreasing simple carbohydrates in her diet. We also reviewed medication options with an emphasis on risk versus benefit of those discussed.   Repetitive spaced learning was employed today to elicit superior memory formation and behavioral change.  9. Class 2 severe obesity with serious comorbidity and body mass index (BMI) of 39.0 to 39.9 in adult, unspecified obesity type (Alexandria Mack)  Alexandria Mack is currently in the action stage of change and her goal is to continue with weight loss efforts. I recommend Alexandria Mack begin the  structured treatment plan as follows:  She has agreed to the [employer].  Exercise goals: No exercise has been prescribed at this time.   Behavioral modification strategies: increasing lean protein intake, meal planning and cooking strategies, keeping healthy foods in the home, and planning for success.  She was informed of the importance of frequent follow-up visits to maximize her success with  intensive lifestyle modifications for her multiple health conditions. She was informed we would discuss her lab results at her next visit unless there is a critical issue that needs to be addressed sooner. Alexandria Mack agreed to keep her next visit at the agreed upon time to discuss these results.  Objective:   Blood pressure 132/85, pulse (!) 45, temperature 98.2 F (36.8 C), height 4\' 11"  (1.499 m), weight 194 lb (88 kg), last menstrual period 08/16/2007, SpO2 100 %. Body mass index is 39.18 kg/m.  EKG: Normal sinus rhythm, rate 49- bradycardia.  Indirect Calorimeter completed today shows a VO2 of 204 and a REE of 1419.  Her calculated basal metabolic rate is 2725 thus her basal metabolic rate is worse than expected.  General: Cooperative, alert, well developed, in no acute distress. HEENT: Conjunctivae and lids unremarkable. Cardiovascular: Regular rhythm.  Lungs: Normal work of breathing. Neurologic: No focal deficits.   Lab Results  Component Value Date   CREATININE 0.85 02/02/2021   BUN 19 02/02/2021   NA 142 02/02/2021   K 4.5 02/02/2021   CL 103 02/02/2021   CO2 24 02/02/2021   Lab Results  Component Value Date   ALT 12 02/02/2021   AST 16 02/02/2021   ALKPHOS 87 02/02/2021   BILITOT 0.6 02/02/2021   Lab Results  Component Value Date   HGBA1C 5.7 (H) 08/02/2019   Lab Results  Component Value Date   INSULIN 12.6 02/02/2021   Lab Results  Component Value Date   TSH 1.540 02/02/2021   Lab Results  Component Value Date   CHOL 177 02/02/2021   HDL 75 02/02/2021   LDLCALC 86 02/02/2021   LDLDIRECT 103 (H) 08/02/2019   TRIG 88 02/02/2021   Lab Results  Component Value Date   WBC 5.6 07/23/2020   HGB 12.6 07/23/2020   HCT 42.6 07/23/2020   MCV 87.8 07/23/2020   PLT 281 07/23/2020   No results found for: IRON, TIBC, FERRITIN  Attestation Statements:   Reviewed by clinician on day of visit: allergies, medications, problem list, medical history, surgical  history, family history, social history, and previous encounter notes.  Coral Ceo, CMA, am acting as transcriptionist for Coralie Common, MD.  This is the patient's first visit at [facility]. The patient's NEW PATIENT PACKET was reviewed at length. Included in the packet: current and past health history, medications, allergies, ROS, gynecologic history (women only), surgical history, family history, social history, weight history, weight loss surgery history (for those that have had weight loss surgery), nutritional evaluation, mood and food questionnaire, PHQ9, Epworth questionnaire, sleep habits questionnaire, patient life and health improvement goals questionnaire. These will all be scanned into the patient's chart under media.   During the visit, I independently reviewed the patient's EKG, bioimpedance scale results, and indirect calorimeter results. I used this information to tailor a meal plan for the patient that will help her to lose weight and will improve her obesity-related conditions going forward. I performed a medically necessary appropriate examination and/or evaluation. I discussed the assessment and treatment plan with the patient. The patient was provided an  opportunity to ask questions and all were answered. The patient agreed with the plan and demonstrated an understanding of the instructions. Labs were ordered at this visit and will be reviewed at the next visit unless more critical results need to be addressed immediately. Clinical information was updated and documented in the EMR.   Time spent on visit including pre-visit chart review and post-visit care was 45 minutes.   A separate 15 minutes was spent on risk counseling (see above).   I have reviewed the above documentation for accuracy and completeness, and I agree with the above. - Jinny Blossom, MD

## 2021-02-15 IMAGING — CT CT CHEST W/ CM
2 of 4 series · 15 of 36 positions shown, 18 images · IV contrast (OMNIPAQUE 300)
Comparison: 07/18/2019

CLINICAL DATA: Mediastinal lymphadenopathy.

EXAM:
CT CHEST WITH CONTRAST
TECHNIQUE: Multidetector CT imaging of the chest was performed during
intravenous contrast administration.
CONTRAST:  80mL OMNIPAQUE IOHEXOL 300 MG/ML  SOLN

[Series 2: thorax · axial · 0.65mm/px · z∈[+1224,+1466]mm · 12 of 145 slices shown, 15 images]
[im 12/145  mediastinal]
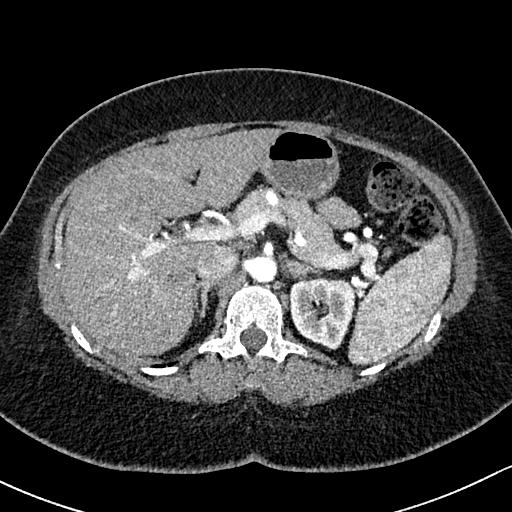
[im 12/145  lung]
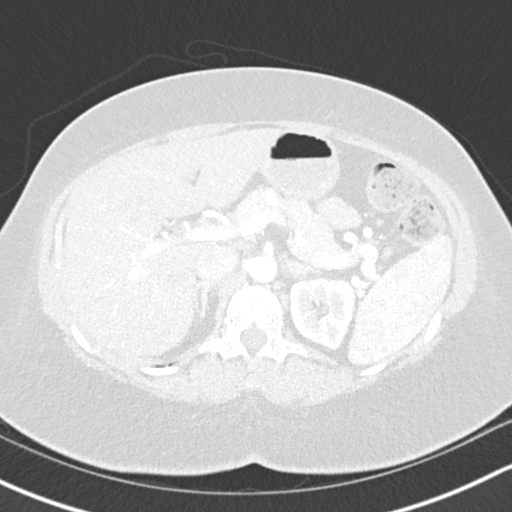
[im 23/145  lung]
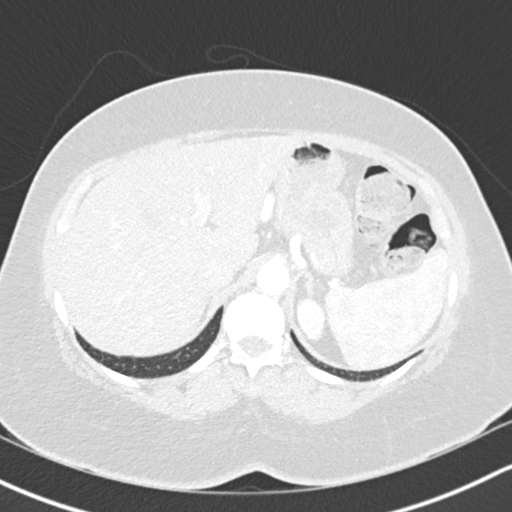
[im 34/145  lung]
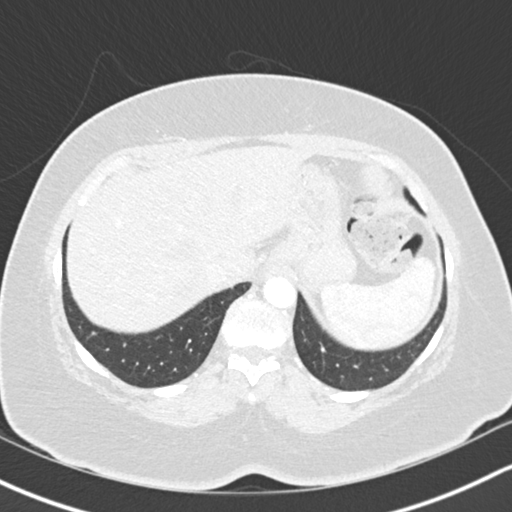
[im 45/145  lung]
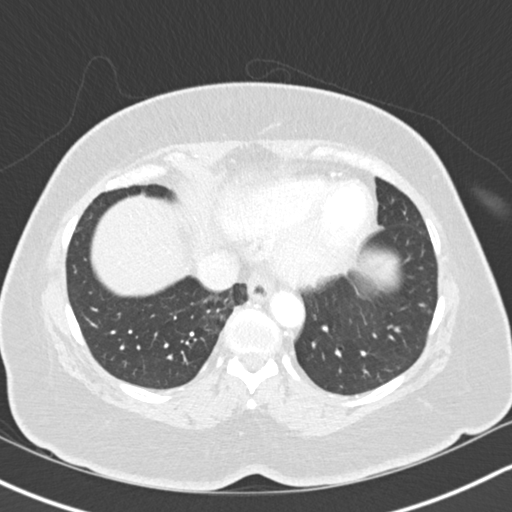
[im 56/145  mediastinal]
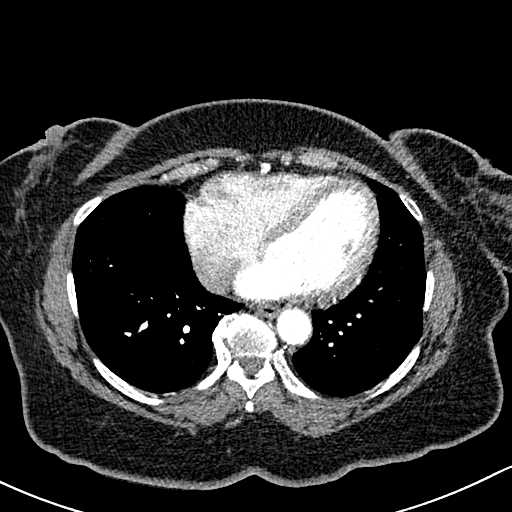
[im 56/145  lung]
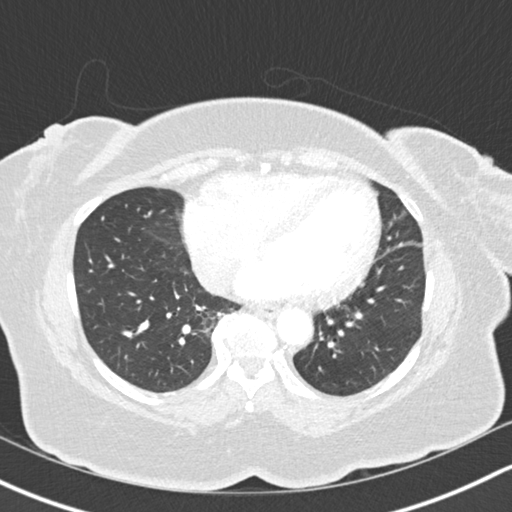
[im 67/145  lung]
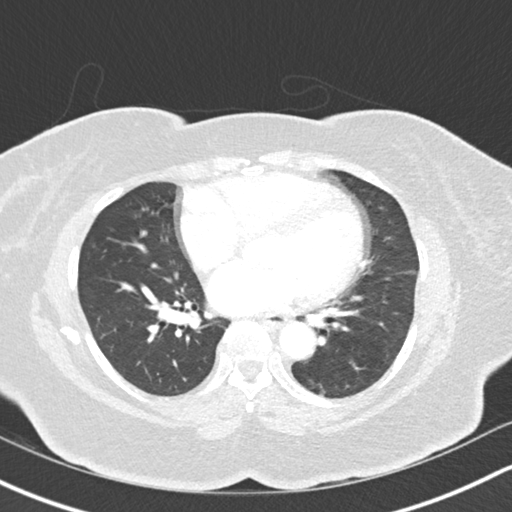
[im 78/145  lung]
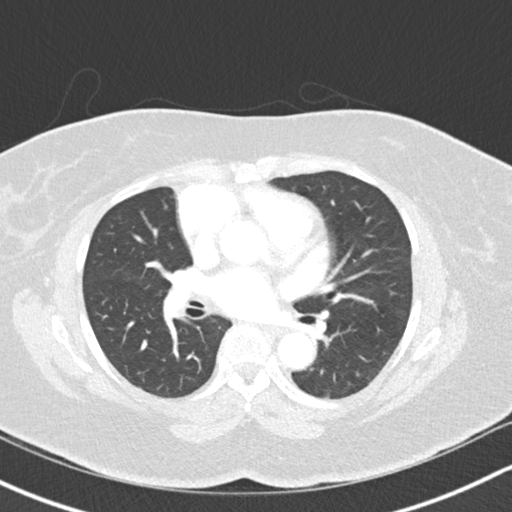
[im 89/145  lung]
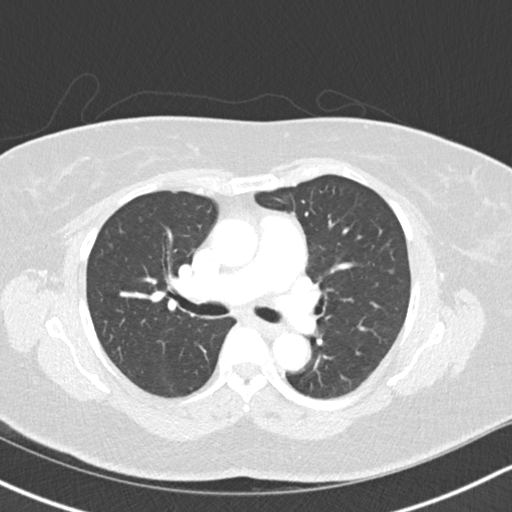
[im 100/145  mediastinal]
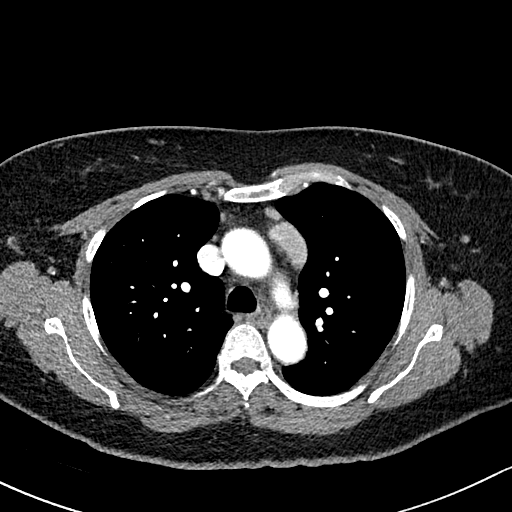
[im 100/145  lung]
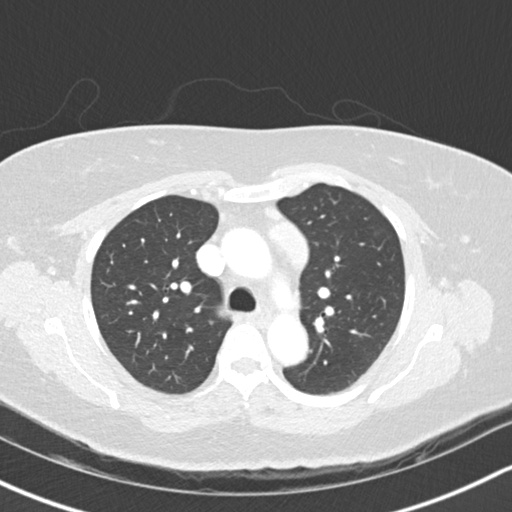
[im 111/145  lung]
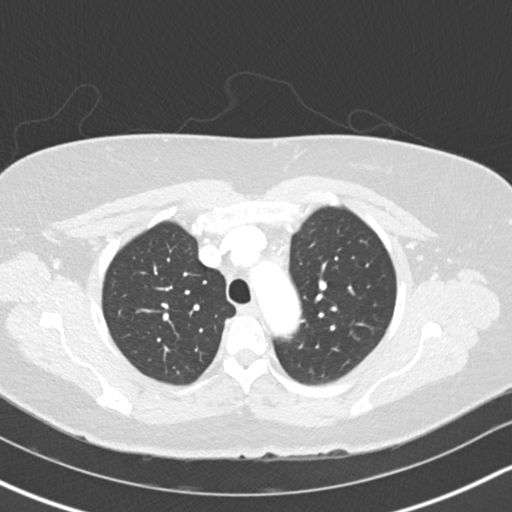
[im 122/145  lung]
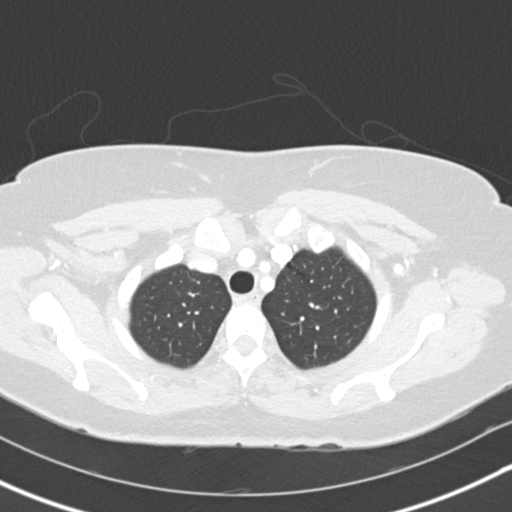
[im 133/145  lung]
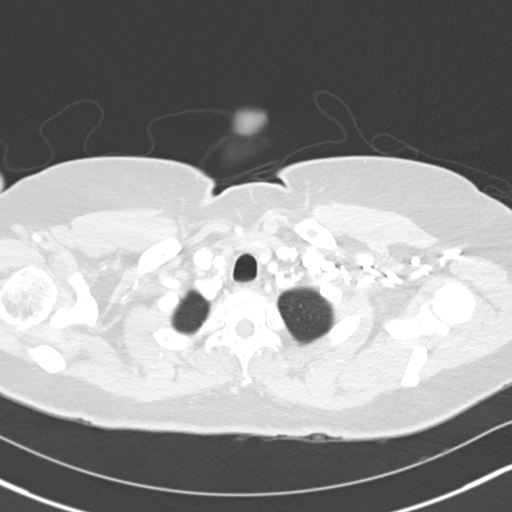

[Series 5: coronal · coronal · 0.56mm/px · 3 of 108 slices shown]
[im 22/108  lung]
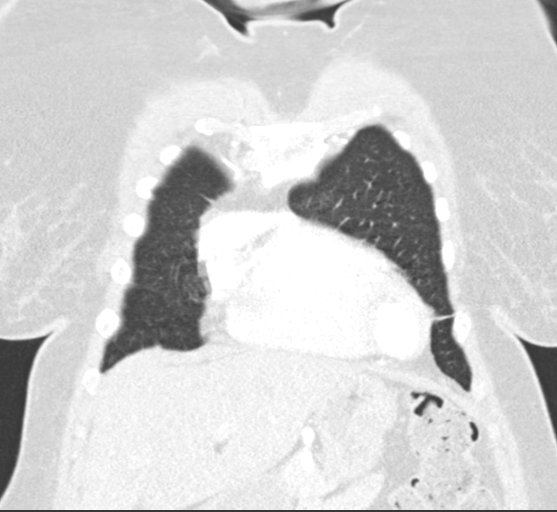
[im 43/108  lung]
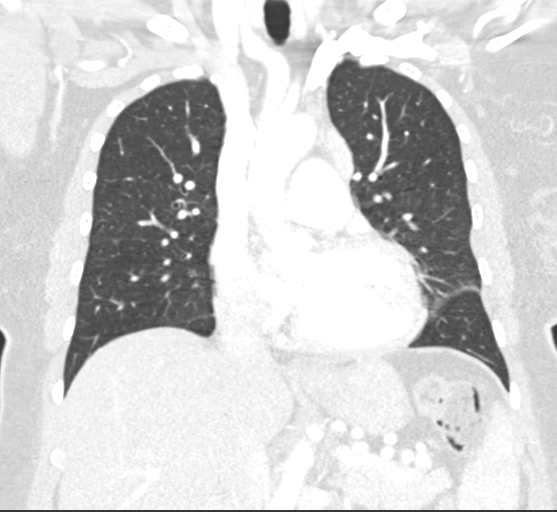
[im 65/108  lung]
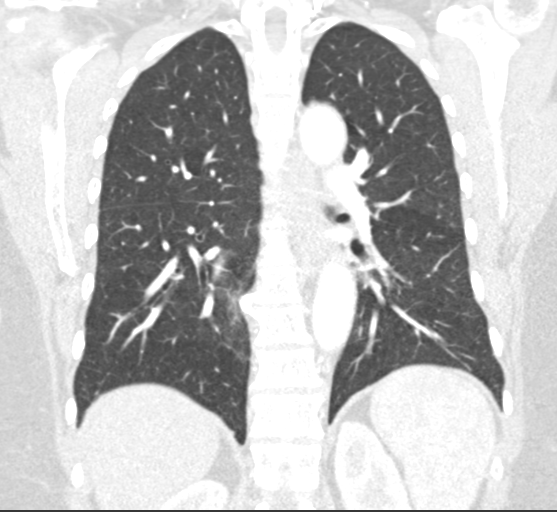

[15 of 36 positions shown; findings below may reference images not displayed]

FINDINGS: Cardiovascular: The heart size is normal. No substantial pericardial
effusion.

Mediastinum/Nodes: 1.4 cm short axis prevascular lymph node is
stable in the interval. The previously identified right internal
mammary node measured at 10 mm short axis is stable at 10 mm today
(image 40/series 2). Other unenlarged prevascular lymph nodes in the
anterior mediastinum are stable. No new or enlarging mediastinal
lymphadenopathy. There is no hilar lymphadenopathy. The esophagus
has normal imaging features. There is no axillary lymphadenopathy.

Lungs/Pleura: 3 mm right upper lobe nodule (72/3) is unchanged in
the interval. 2 mm left lower lobe nodule on 85/3 was probably
present previously but partially obscured by motion artifact. 5 mm
posterior left lower lobe nodule on 78/3 is stable. No new
suspicious nodule or mass. No focal airspace consolidation. No
pleural effusion.

Upper Abdomen: Unremarkable.

Musculoskeletal: No worrisome lytic or sclerotic osseous
abnormality.
IMPRESSION: 1. Stable appearance of mildly enlarged prevascular and right
internal mammary lymph nodes. 3 months of imaging stability is
reassuring. Follow-up CT in 3-6 months recommended to ensure
continued stability.
2. Stable tiny bilateral pulmonary nodules measuring up to 5 mm. No
follow-up needed if patient is low-risk (and has no known or
suspected primary neoplasm). Non-contrast chest CT can be considered
in 12 months if patient is high-risk. This recommendation follows
the consensus statement: Guidelines for Management of Incidental
Pulmonary Nodules Detected on CT Images: From the [HOSPITAL]

## 2021-02-16 ENCOUNTER — Ambulatory Visit (INDEPENDENT_AMBULATORY_CARE_PROVIDER_SITE_OTHER): Payer: BC Managed Care – PPO | Admitting: Family Medicine

## 2021-02-16 ENCOUNTER — Encounter (INDEPENDENT_AMBULATORY_CARE_PROVIDER_SITE_OTHER): Payer: Self-pay | Admitting: Family Medicine

## 2021-02-16 ENCOUNTER — Other Ambulatory Visit: Payer: Self-pay

## 2021-02-16 VITALS — BP 130/88 | HR 51 | Temp 97.7°F | Ht 59.0 in | Wt 193.0 lb

## 2021-02-16 DIAGNOSIS — E559 Vitamin D deficiency, unspecified: Secondary | ICD-10-CM

## 2021-02-16 DIAGNOSIS — Z9189 Other specified personal risk factors, not elsewhere classified: Secondary | ICD-10-CM

## 2021-02-16 DIAGNOSIS — I10 Essential (primary) hypertension: Secondary | ICD-10-CM

## 2021-02-16 DIAGNOSIS — Z6839 Body mass index (BMI) 39.0-39.9, adult: Secondary | ICD-10-CM

## 2021-02-16 DIAGNOSIS — R7303 Prediabetes: Secondary | ICD-10-CM | POA: Diagnosis not present

## 2021-02-16 MED ORDER — VITAMIN D (ERGOCALCIFEROL) 1.25 MG (50000 UNIT) PO CAPS: 50000.0000 [IU] | ORAL_CAPSULE | ORAL | 0 refills | Status: DC

## 2021-02-18 NOTE — Progress Notes (Signed)
Chief Complaint:   OBESITY Alexandria Mack is here to discuss her progress with her obesity treatment plan along with follow-up of her obesity related diagnoses. Alexandria Mack is on the [employer] and states she is following her eating plan approximately 80% of the time. Alexandria Mack states she is walking 5-10 minutes 2 times per week.  Today's visit was #: 2 Starting weight: 194 lbs Starting date: 02/02/2021 Today's weight: 193 lbs Today's date: 02/16/2021 Total lbs lost to date: 1 Total lbs lost since last in-office visit: 1  Interim History: Alexandria Mack had to wait to meal plan in order to get all stuff on meal plans. For breakfast, she is mainly doing 4 eggs- 3 whites and 1 egg. Doing salad at lunch with tuna pouches and toppings. Dinner is 6-8 oz fish and 2 cups of vegetables. She normally is not hungry. Felt blown away by the quantity of food she needed. She went out of town over the weekend and had some indulgent eating and drinking.  Subjective:   1. Pre-diabetes Alexandria Mack's last A1c was 6.0 on 11/2020. She is not on medication. She has an insulin level of 12.6- minimal carb cravings.  2. Vitamin D deficiency Alexandria Mack is not on Vit D supplementation. She has a Vit D level of 37.5.  3. Primary hypertension BP well controlled today. Pt denies chest pain/chest pressure/headache. Pt's HR is still low.  4. At risk for osteoporosis Alexandria Mack is at higher risk of osteopenia and osteoporosis due to Vitamin D deficiency.   Assessment/Plan:   1. Pre-diabetes Alexandria Mack will continue to work on weight loss, exercise, and decreasing simple carbohydrates to help decrease the risk of diabetes. Continue Pescatarian plan or category 2.  2. Vitamin D deficiency Low Vitamin D level contributes to fatigue and are associated with obesity, breast, and colon cancer. She agrees to start to take prescription Vitamin D @50 ,000 IU every week and will follow-up for routine testing of Vitamin D, at least 2-3 times  per year to avoid over-replacement.  Start- Vitamin D, Ergocalciferol, (DRISDOL) 1.25 MG (50000 UNIT) CAPS capsule; Take 1 capsule (50,000 Units total) by mouth every 7 (seven) days.  Dispense: 4 capsule; Refill: 0  3. Primary hypertension Alexandria Mack is working on healthy weight loss and exercise to improve blood pressure control. We will watch for signs of hypotension as she continues her lifestyle modifications. Pt is to follow up with Dr. Einar Gip.  4. At risk for osteoporosis Alexandria Mack was given approximately 30 minutes of osteoporosis prevention counseling today. Alexandria Mack is at risk for osteopenia and osteoporosis due to her Vitamin D deficiency. She was encouraged to take her Vitamin D and follow her higher calcium diet and increase strengthening exercise to help strengthen her bones and decrease her risk of osteopenia and osteoporosis.  Repetitive spaced learning was employed today to elicit superior memory formation and behavioral change.  5. Class 2 severe obesity with serious comorbidity and body mass index (BMI) of 39.0 to 39.9 in adult, unspecified obesity type (Alexandria Mack)  Alexandria Mack is currently in the action stage of change. As such, her goal is to continue with weight loss efforts. She has agreed to the [employer].   Exercise goals: All adults should avoid inactivity. Some physical activity is better than none, and adults who participate in any amount of physical activity gain some health benefits.  Behavioral modification strategies: increasing lean protein intake, meal planning and cooking strategies, keeping healthy foods in the home, and planning for success.  Alexandria Mack has agreed  to follow-up with our clinic in 2-3 weeks. She was informed of the importance of frequent follow-up visits to maximize her success with intensive lifestyle modifications for her multiple health conditions.   Objective:   Blood pressure 130/88, pulse (!) 51, temperature 97.7 F (36.5 C), height 4\' 11"   (1.499 m), weight 193 lb (87.5 kg), last menstrual period 08/16/2007, SpO2 99 %. Body mass index is 38.98 kg/m.  General: Cooperative, alert, well developed, in no acute distress. HEENT: Conjunctivae and lids unremarkable. Cardiovascular: Regular rhythm.  Lungs: Normal work of breathing. Neurologic: No focal deficits.   Lab Results  Component Value Date   CREATININE 0.85 02/02/2021   BUN 19 02/02/2021   NA 142 02/02/2021   K 4.5 02/02/2021   CL 103 02/02/2021   CO2 24 02/02/2021   Lab Results  Component Value Date   ALT 12 02/02/2021   AST 16 02/02/2021   ALKPHOS 87 02/02/2021   BILITOT 0.6 02/02/2021   Lab Results  Component Value Date   HGBA1C 5.7 (H) 08/02/2019   Lab Results  Component Value Date   INSULIN 12.6 02/02/2021   Lab Results  Component Value Date   TSH 1.540 02/02/2021   Lab Results  Component Value Date   CHOL 177 02/02/2021   HDL 75 02/02/2021   LDLCALC 86 02/02/2021   LDLDIRECT 103 (H) 08/02/2019   TRIG 88 02/02/2021   Lab Results  Component Value Date   VD25OH 37.5 02/02/2021   Lab Results  Component Value Date   WBC 5.6 07/23/2020   HGB 12.6 07/23/2020   HCT 42.6 07/23/2020   MCV 87.8 07/23/2020   PLT 281 07/23/2020   No results found for: IRON, TIBC, FERRITIN  Attestation Statements:   Reviewed by clinician on day of visit: allergies, medications, problem list, medical history, surgical history, family history, social history, and previous encounter notes.  Coral Ceo, CMA, am acting as transcriptionist for Coralie Common, MD.   I have reviewed the above documentation for accuracy and completeness, and I agree with the above. - Jinny Blossom, MD

## 2021-02-26 DIAGNOSIS — Z0000 Encounter for general adult medical examination without abnormal findings: Secondary | ICD-10-CM | POA: Diagnosis not present

## 2021-02-26 DIAGNOSIS — G4733 Obstructive sleep apnea (adult) (pediatric): Secondary | ICD-10-CM | POA: Diagnosis not present

## 2021-02-26 DIAGNOSIS — I10 Essential (primary) hypertension: Secondary | ICD-10-CM | POA: Diagnosis not present

## 2021-03-08 ENCOUNTER — Encounter (INDEPENDENT_AMBULATORY_CARE_PROVIDER_SITE_OTHER): Payer: Self-pay | Admitting: Family Medicine

## 2021-03-08 ENCOUNTER — Ambulatory Visit (INDEPENDENT_AMBULATORY_CARE_PROVIDER_SITE_OTHER): Payer: BC Managed Care – PPO | Admitting: Family Medicine

## 2021-03-08 ENCOUNTER — Other Ambulatory Visit: Payer: Self-pay

## 2021-03-08 VITALS — BP 119/63 | HR 60 | Temp 98.3°F | Ht 59.0 in | Wt 192.0 lb

## 2021-03-08 DIAGNOSIS — E559 Vitamin D deficiency, unspecified: Secondary | ICD-10-CM

## 2021-03-08 DIAGNOSIS — Z6839 Body mass index (BMI) 39.0-39.9, adult: Secondary | ICD-10-CM

## 2021-03-08 DIAGNOSIS — Z9189 Other specified personal risk factors, not elsewhere classified: Secondary | ICD-10-CM

## 2021-03-08 DIAGNOSIS — I10 Essential (primary) hypertension: Secondary | ICD-10-CM

## 2021-03-08 MED ORDER — VITAMIN D (ERGOCALCIFEROL) 1.25 MG (50000 UNIT) PO CAPS: 50000.0000 [IU] | ORAL_CAPSULE | ORAL | 0 refills | Status: DC

## 2021-03-10 NOTE — Progress Notes (Signed)
Chief Complaint:   OBESITY Alexandria Mack is here to discuss her progress with her obesity treatment plan along with follow-up of her obesity related diagnoses. Alexandria Mack is on the [employer] and states she is following her eating plan approximately 85-90% of the time. Khalea states she is walking at least a mile 4 times per week.  Today's visit was #: 3 Starting weight: 194 lbs Starting date: 02/02/2021 Today's weight: 192 lbs Today's date: 03/08/2021 Total lbs lost to date: 2 Total lbs lost since last in-office visit: 1  Interim History: Over the last few weeks Alexandria Mack followed the meal plan very strictly initially but struggled with adherence at [facility]. She did quite a bit of walking and indulged in some alcohol. She tried to order protein in adherence to plan. She is hoping for greater loss. She is going to [place] this weekend.   Subjective:   1. Vitamin D deficiency Alexandria Mack denies nausea, vomiting, and muscle weakness but notes fatigue. Pt is on prescription Vit D.  2. Primary hypertension Her BP is well controlled today (better controlled from previously). Pt denies chest pain/chest pressure/headache.  3. At risk for osteoporosis Alexandria Mack is at higher risk of osteopenia and osteoporosis due to Vitamin D deficiency.   Assessment/Plan:   1. Vitamin D deficiency Low Vitamin D level contributes to fatigue and are associated with obesity, breast, and colon cancer. She agrees to continue to take prescription Vitamin D '@50'$ ,000 IU every week and will follow-up for routine testing of Vitamin D, at least 2-3 times per year to avoid over-replacement.  Refill- Vitamin D, Ergocalciferol, (DRISDOL) 1.25 MG (50000 UNIT) CAPS capsule; Take 1 capsule (50,000 Units total) by mouth every 7 (seven) days.  Dispense: 4 capsule; Refill: 0  2. Primary hypertension Alexandria Mack is working on healthy weight loss and exercise to improve blood pressure control. We will watch for signs of  hypotension as she continues her lifestyle modifications. Follow up at next appt.  3. At risk for osteoporosis Alexandria Mack was given approximately 15 minutes of osteoporosis prevention counseling today. Alexandria Mack is at risk for osteopenia and osteoporosis due to her Vitamin D deficiency. She was encouraged to take her Vitamin D and follow her higher calcium diet and increase strengthening exercise to help strengthen her bones and decrease her risk of osteopenia and osteoporosis.  Repetitive spaced learning was employed today to elicit superior memory formation and behavioral change.  4. Obesity with current BMI of 38.8  Alexandria Mack is currently in the action stage of change. As such, her goal is to continue with weight loss efforts. She has agreed to the [employer].   Exercise goals: All adults should avoid inactivity. Some physical activity is better than none, and adults who participate in any amount of physical activity gain some health benefits.  Behavioral modification strategies: increasing lean protein intake, meal planning and cooking strategies, keeping healthy foods in the home, and planning for success.  Alexandria Mack has agreed to follow-up with our clinic in 2-3 weeks. She was informed of the importance of frequent follow-up visits to maximize her success with intensive lifestyle modifications for her multiple health conditions.   Objective:   Blood pressure 119/63, pulse 60, temperature 98.3 F (36.8 C), height '4\' 11"'$  (1.499 m), weight 192 lb (87.1 kg), last menstrual period 08/16/2007, SpO2 98 %. Body mass index is 38.78 kg/m.  General: Cooperative, alert, well developed, in no acute distress. HEENT: Conjunctivae and lids unremarkable. Cardiovascular: Regular rhythm.  Lungs: Normal work of breathing.  Neurologic: No focal deficits.   Lab Results  Component Value Date   CREATININE 0.85 02/02/2021   BUN 19 02/02/2021   NA 142 02/02/2021   K 4.5 02/02/2021   CL 103 02/02/2021    CO2 24 02/02/2021   Lab Results  Component Value Date   ALT 12 02/02/2021   AST 16 02/02/2021   ALKPHOS 87 02/02/2021   BILITOT 0.6 02/02/2021   Lab Results  Component Value Date   HGBA1C 5.7 (H) 08/02/2019   Lab Results  Component Value Date   INSULIN 12.6 02/02/2021   Lab Results  Component Value Date   TSH 1.540 02/02/2021   Lab Results  Component Value Date   CHOL 177 02/02/2021   HDL 75 02/02/2021   LDLCALC 86 02/02/2021   LDLDIRECT 103 (H) 08/02/2019   TRIG 88 02/02/2021   Lab Results  Component Value Date   VD25OH 37.5 02/02/2021   Lab Results  Component Value Date   WBC 5.6 07/23/2020   HGB 12.6 07/23/2020   HCT 42.6 07/23/2020   MCV 87.8 07/23/2020   PLT 281 07/23/2020    Attestation Statements:   Reviewed by clinician on day of visit: allergies, medications, problem list, medical history, surgical history, family history, social history, and previous encounter notes.  Coral Ceo, CMA, am acting as transcriptionist for Coralie Common, MD.   I have reviewed the above documentation for accuracy and completeness, and I agree with the above. - Coralie Common, MD

## 2021-03-16 ENCOUNTER — Other Ambulatory Visit: Payer: Self-pay

## 2021-03-16 DIAGNOSIS — I10 Essential (primary) hypertension: Secondary | ICD-10-CM

## 2021-03-16 MED ORDER — NEBIVOLOL HCL 10 MG PO TABS: 10.0000 mg | ORAL_TABLET | Freq: Every day | ORAL | 3 refills | Status: AC

## 2021-03-24 ENCOUNTER — Encounter (INDEPENDENT_AMBULATORY_CARE_PROVIDER_SITE_OTHER): Payer: Self-pay | Admitting: Family Medicine

## 2021-03-24 ENCOUNTER — Ambulatory Visit (INDEPENDENT_AMBULATORY_CARE_PROVIDER_SITE_OTHER): Payer: BC Managed Care – PPO | Admitting: Family Medicine

## 2021-03-24 ENCOUNTER — Other Ambulatory Visit: Payer: Self-pay

## 2021-03-24 VITALS — BP 123/80 | HR 50 | Temp 98.3°F | Ht 59.0 in | Wt 189.0 lb

## 2021-03-24 DIAGNOSIS — Z9189 Other specified personal risk factors, not elsewhere classified: Secondary | ICD-10-CM | POA: Diagnosis not present

## 2021-03-24 DIAGNOSIS — E559 Vitamin D deficiency, unspecified: Secondary | ICD-10-CM | POA: Diagnosis not present

## 2021-03-24 DIAGNOSIS — I10 Essential (primary) hypertension: Secondary | ICD-10-CM

## 2021-03-24 DIAGNOSIS — Z6839 Body mass index (BMI) 39.0-39.9, adult: Secondary | ICD-10-CM | POA: Diagnosis not present

## 2021-03-24 MED ORDER — AMLODIPINE BESYLATE 10 MG PO TABS: 5.0000 mg | ORAL_TABLET | Freq: Every day | ORAL | 0 refills | Status: DC

## 2021-03-24 NOTE — Progress Notes (Signed)
Chief Complaint:   OBESITY Alexandria Mack is here to discuss her progress with her obesity treatment plan along with follow-up of her obesity related diagnoses. Alexandria Mack is on the [employer] and states she is following her eating plan approximately 95% of the time. Alexandria Mack states she is stretching 5 minutes 3 times per week.  Today's visit was #: 4 Starting weight: 194 lbs Starting date: 02/02/2021 Today's weight: 189 lbs Today's date: 03/24/2021 Total lbs lost to date: 5 Total lbs lost since last in-office visit: 3  Interim History: Alexandria Mack has done really well on the [employer] plan. She added 4 oz of red wine to her diet as a replacement (like her apple for red wine). She is still using the butter spray and has decreased egg intake. She has no upcoming plans. She canceled her trip to [place].  Subjective:   1. Primary hypertension Alexandria Mack's BP is controlled today. Pt denies chest pain/chest pressure/headache. She is on Bystolic, Aldactone, and Norvasc.  2. Vitamin D deficiency She denies nausea, vomiting, and muscle weakness but notes fatigue. Pt is on prescription Vit D.  3. At risk for hypertension Alexandria Mack is at risk for hypertension due to decreasing BP meds.  Assessment/Plan:   1. Primary hypertension Alexandria Mack is working on healthy weight loss and exercise to improve blood pressure control. We will watch for signs of hypotension as she continues her lifestyle modifications. Decrease amlodipine to 5 mg daily.  Refill with new instructions- amLODipine (NORVASC) 10 MG tablet; Take 0.5 tablets (5 mg total) by mouth daily. TAKE 1 TABLET BY MOUTH EVERY DAY IN THE EVENING  Dispense: 30 tablet; Refill: 0  2. Vitamin D deficiency Low Vitamin D level contributes to fatigue and are associated with obesity, breast, and colon cancer. She agrees to continue to take prescription Vitamin D 50,000 IU every week and will follow-up for routine testing of Vitamin D, at least 2-3 times  per year to avoid over-replacement. Repeat labs in 2 months.  3. At risk for hypertension Alexandria Mack was given approximately 15 minutes of hypertension prevention counseling today. Alexandria Mack is at risk for hypertension due to obesity. We discussed intensive lifestyle modifications today with an emphasis on weight loss as well as increasing exercise and decreasing salt intake.  Repetitive spaced learning was employed today to elicit superior memory formation and behavioral change.  4. Obesity with current BMI of 38.3  Alexandria Mack is currently in the action stage of change. As such, her goal is to continue with weight loss efforts. She has agreed to the Category 2 Plan and the [place].   Exercise goals:  Pt joined the [facility] so she will be starting swimming and yoga in the next few weeks.  Behavioral modification strategies: increasing lean protein intake, meal planning and cooking strategies, and keeping healthy foods in the home.  Alexandria Mack has agreed to follow-up with our clinic in 3 weeks. She was informed of the importance of frequent follow-up visits to maximize her success with intensive lifestyle modifications for her multiple health conditions.   Objective:   Blood pressure 123/80, pulse (!) 50, temperature 98.3 F (36.8 C), height '4\' 11"'$  (1.499 m), weight 189 lb (85.7 kg), last menstrual period 08/16/2007, SpO2 99 %. Body mass index is 38.17 kg/m.  General: Cooperative, alert, well developed, in no acute distress. HEENT: Conjunctivae and lids unremarkable. Cardiovascular: Regular rhythm.  Lungs: Normal work of breathing. Neurologic: No focal deficits.   Lab Results  Component Value Date  CREATININE 0.85 02/02/2021   BUN 19 02/02/2021   NA 142 02/02/2021   K 4.5 02/02/2021   CL 103 02/02/2021   CO2 24 02/02/2021   Lab Results  Component Value Date   ALT 12 02/02/2021   AST 16 02/02/2021   ALKPHOS 87 02/02/2021   BILITOT 0.6 02/02/2021   Lab Results   Component Value Date   HGBA1C 5.7 (H) 08/02/2019   Lab Results  Component Value Date   INSULIN 12.6 02/02/2021   Lab Results  Component Value Date   TSH 1.540 02/02/2021   Lab Results  Component Value Date   CHOL 177 02/02/2021   HDL 75 02/02/2021   LDLCALC 86 02/02/2021   LDLDIRECT 103 (H) 08/02/2019   TRIG 88 02/02/2021   Lab Results  Component Value Date   VD25OH 37.5 02/02/2021   Lab Results  Component Value Date   WBC 5.6 07/23/2020   HGB 12.6 07/23/2020   HCT 42.6 07/23/2020   MCV 87.8 07/23/2020   PLT 281 07/23/2020    Attestation Statements:   Reviewed by clinician on day of visit: allergies, medications, problem list, medical history, surgical history, family history, social history, and previous encounter notes.  Coral Ceo, CMA, am acting as transcriptionist for Coralie Common, MD.  I have reviewed the above documentation for accuracy and completeness, and I agree with the above. - Coralie Common, MD

## 2021-04-15 ENCOUNTER — Other Ambulatory Visit: Payer: Self-pay

## 2021-04-15 ENCOUNTER — Ambulatory Visit (INDEPENDENT_AMBULATORY_CARE_PROVIDER_SITE_OTHER)
Admission: RE | Admit: 2021-04-15 | Discharge: 2021-04-15 | Disposition: A | Discharge status: Home / Self Care | Payer: BC Managed Care – PPO | Source: Ambulatory Visit | Attending: Internal Medicine | Admitting: Internal Medicine

## 2021-04-15 DIAGNOSIS — R590 Localized enlarged lymph nodes: Secondary | ICD-10-CM

## 2021-04-15 DIAGNOSIS — R911 Solitary pulmonary nodule: Secondary | ICD-10-CM | POA: Diagnosis not present

## 2021-04-16 ENCOUNTER — Telehealth: Payer: Self-pay | Admitting: Internal Medicine

## 2021-04-16 NOTE — Telephone Encounter (Signed)
I called and spoke with patient regarding CT results and went over Dr .Ammie Dalton. Patient verbalized understanding, nothing further needed.

## 2021-04-20 ENCOUNTER — Encounter: Payer: Self-pay | Admitting: *Deleted

## 2021-04-20 ENCOUNTER — Encounter (INDEPENDENT_AMBULATORY_CARE_PROVIDER_SITE_OTHER): Payer: Self-pay | Admitting: Family Medicine

## 2021-04-20 ENCOUNTER — Ambulatory Visit (INDEPENDENT_AMBULATORY_CARE_PROVIDER_SITE_OTHER): Payer: BC Managed Care – PPO | Admitting: Family Medicine

## 2021-04-20 ENCOUNTER — Other Ambulatory Visit: Payer: Self-pay

## 2021-04-20 VITALS — BP 137/80 | HR 53 | Temp 98.1°F | Ht 59.0 in | Wt 190.0 lb

## 2021-04-20 DIAGNOSIS — R002 Palpitations: Secondary | ICD-10-CM | POA: Diagnosis not present

## 2021-04-20 DIAGNOSIS — E559 Vitamin D deficiency, unspecified: Secondary | ICD-10-CM

## 2021-04-20 DIAGNOSIS — Z9189 Other specified personal risk factors, not elsewhere classified: Secondary | ICD-10-CM

## 2021-04-20 DIAGNOSIS — Z6839 Body mass index (BMI) 39.0-39.9, adult: Secondary | ICD-10-CM

## 2021-04-20 MED ORDER — VITAMIN D (ERGOCALCIFEROL) 1.25 MG (50000 UNIT) PO CAPS: 50000.0000 [IU] | ORAL_CAPSULE | ORAL | 0 refills | Status: DC

## 2021-04-20 NOTE — Progress Notes (Signed)
Chief Complaint:   OBESITY Alexandria Mack is here to discuss her progress with her obesity treatment plan along with follow-up of her obesity related diagnoses. Alexandria Mack is on the Category 2 Plan and the [place] and states she is following her eating plan approximately 90% of the time. Alexandria Mack states she is swimming 60 minutes 1 times per week.  Today's visit was #: 5 Starting weight: 194 lbs Starting date: 02/02/2021 Today's weight: 190 lbs Today's date: 04/20/2021 Total lbs lost to date: 4 Total lbs lost since last in-office visit: 0  Interim History: Alexandria Mack has been having palpitations for the last 4 days- frequent in occurrence and concerning to pt. She did eat foods high in potassium due to worries that she had low potassium. She had an increase in stress prior to palpitations occurring. Pt is currently asymptomatic. Her birthday is coming up and she and her husband are going out of town twice around her birthday. Her biggest obstacle is eating fried chicken or fried foods a few times.  Subjective:   1. Palpitations BP controlled today. Pt is asymptomatic. Pt denies chest pain or chest pressure.  2. Vitamin D deficiency Pt denies nausea, vomiting, and muscle weakness but notes fatigue. She is on prescription Vit D.  3. At risk for osteoporosis Alexandria Mack is at higher risk of osteopenia and osteoporosis due to Vitamin D deficiency.   Assessment/Plan:   1. Palpitations Pt is encouraged to check pulse if symptoms return and was also advised to seek medical care/evaluation if symptoms return.  2. Vitamin D deficiency Low Vitamin D level contributes to fatigue and are associated with obesity, breast, and colon cancer. She agrees to continue to take prescription Vitamin D 50,000 IU every week and will follow-up for routine testing of Vitamin D, at least 2-3 times per year to avoid over-replacement.  Refill- Vitamin D, Ergocalciferol, (DRISDOL) 1.25 MG (50000 UNIT) CAPS  capsule; Take 1 capsule (50,000 Units total) by mouth every 7 (seven) days.  Dispense: 4 capsule; Refill: 0  3. At risk for osteoporosis Alexandria Mack was given approximately 15 minutes of osteoporosis prevention counseling today. Alexandria Mack is at risk for osteopenia and osteoporosis due to her Vitamin D deficiency. She was encouraged to take her Vitamin D and follow her higher calcium diet and increase strengthening exercise to help strengthen her bones and decrease her risk of osteopenia and osteoporosis.  Repetitive spaced learning was employed today to elicit superior memory formation and behavioral change.  4. Obesity with current BMI of 38.4  Alexandria Mack is currently in the action stage of change. As such, her goal is to continue with weight loss efforts. She has agreed to the Category 2 Plan and the [place].   Exercise goals:  Pt is to aim to go to the [employer] 2-3 times a week.  Behavioral modification strategies: increasing lean protein intake, meal planning and cooking strategies, and keeping healthy foods in the home.  Amir has agreed to follow-up with our clinic in 3 weeks. She was informed of the importance of frequent follow-up visits to maximize her success with intensive lifestyle modifications for her multiple health conditions.   Objective:   Blood pressure 137/80, pulse (!) 53, temperature 98.1 F (36.7 C), height '4\' 11"'$  (1.499 m), weight 190 lb (86.2 kg), last menstrual period 08/16/2007, SpO2 99 %. Body mass index is 38.38 kg/m.  General: Cooperative, alert, well developed, in no acute distress. HEENT: Conjunctivae and lids unremarkable. Cardiovascular: Regular rhythm.  Lungs: Normal work  of breathing. Neurologic: No focal deficits.   Lab Results  Component Value Date   CREATININE 0.85 02/02/2021   BUN 19 02/02/2021   NA 142 02/02/2021   K 4.5 02/02/2021   CL 103 02/02/2021   CO2 24 02/02/2021   Lab Results  Component Value Date   ALT 12 02/02/2021   AST  16 02/02/2021   ALKPHOS 87 02/02/2021   BILITOT 0.6 02/02/2021   Lab Results  Component Value Date   HGBA1C 5.7 (H) 08/02/2019   Lab Results  Component Value Date   INSULIN 12.6 02/02/2021   Lab Results  Component Value Date   TSH 1.540 02/02/2021   Lab Results  Component Value Date   CHOL 177 02/02/2021   HDL 75 02/02/2021   LDLCALC 86 02/02/2021   LDLDIRECT 103 (H) 08/02/2019   TRIG 88 02/02/2021   Lab Results  Component Value Date   VD25OH 37.5 02/02/2021   Lab Results  Component Value Date   WBC 5.6 07/23/2020   HGB 12.6 07/23/2020   HCT 42.6 07/23/2020   MCV 87.8 07/23/2020   PLT 281 07/23/2020    Attestation Statements:   Reviewed by clinician on day of visit: allergies, medications, problem list, medical history, surgical history, family history, social history, and previous encounter notes.  Coral Ceo, CMA, am acting as transcriptionist for Coralie Common, MD.  I have reviewed the above documentation for accuracy and completeness, and I agree with the above. - Coralie Common, MD

## 2021-04-20 NOTE — Telephone Encounter (Signed)
From pt

## 2021-05-11 ENCOUNTER — Ambulatory Visit (INDEPENDENT_AMBULATORY_CARE_PROVIDER_SITE_OTHER): Payer: BC Managed Care – PPO | Admitting: Family Medicine

## 2021-05-11 ENCOUNTER — Encounter (INDEPENDENT_AMBULATORY_CARE_PROVIDER_SITE_OTHER): Payer: Self-pay | Admitting: Family Medicine

## 2021-05-11 ENCOUNTER — Other Ambulatory Visit: Payer: Self-pay

## 2021-05-11 VITALS — BP 117/75 | HR 49 | Temp 98.0°F | Ht 59.0 in | Wt 188.0 lb

## 2021-05-11 DIAGNOSIS — I10 Essential (primary) hypertension: Secondary | ICD-10-CM

## 2021-05-11 DIAGNOSIS — Z9189 Other specified personal risk factors, not elsewhere classified: Secondary | ICD-10-CM | POA: Diagnosis not present

## 2021-05-11 DIAGNOSIS — Z6839 Body mass index (BMI) 39.0-39.9, adult: Secondary | ICD-10-CM | POA: Diagnosis not present

## 2021-05-11 DIAGNOSIS — E559 Vitamin D deficiency, unspecified: Secondary | ICD-10-CM

## 2021-05-11 MED ORDER — AMLODIPINE BESYLATE 5 MG PO TABS: 5.0000 mg | ORAL_TABLET | Freq: Every day | ORAL | 0 refills | Status: AC

## 2021-05-11 MED ORDER — VITAMIN D (ERGOCALCIFEROL) 1.25 MG (50000 UNIT) PO CAPS
50000.0000 [IU] | ORAL_CAPSULE | ORAL | 0 refills | Status: AC
Start: 2021-05-11 — End: ?

## 2021-05-11 NOTE — Progress Notes (Signed)
Chief Complaint:   OBESITY Alexandria Mack is here to discuss her progress with her obesity treatment plan along with follow-up of her obesity related diagnoses. Alexandria Mack is on the Category 2 Plan and the [place] and states she is following her eating plan approximately 25% of the time. Alexandria Mack states she is walking 20 minutes 4 times per week.  Today's visit was #: 6 Starting weight: 194 lbs Starting date: 02/02/2021 Today's weight: 188 lbs Today's date: 05/11/2021 Total lbs lost to date: 6 Total lbs lost since last in-office visit: 2  Interim History: Alexandria Mack went to [facility] and [place] and did a bunch of walking and was very food conscious. When at home, she followed plan, but was conscious of choices when away. She wants to get back on meal plan since returning home  and will continue walking 15-20 minutes most days.  Subjective:   1. Essential hypertension BP well controlled today. Pt denies chest pain/chest pressure/headache.  2. Vitamin D deficiency Alexandria Mack's last Vit D level was 37.5 and she is taking prescription Vit D.  3. At risk for hypertension Alexandria Mack is at risk for hypertension due to decreasing amlodipine.  Assessment/Plan:   1. Essential hypertension Alexandria Mack is working on healthy weight loss and exercise to improve blood pressure control. We will watch for signs of hypotension as she continues her lifestyle modifications. Continue current treatment plan.  Refill- amLODipine (NORVASC) 5 MG tablet; Take 1 tablet (5 mg total) by mouth daily. TAKE 1 TABLET BY MOUTH EVERY DAY IN THE EVENING  Dispense: 30 tablet; Refill: 0  2. Vitamin D deficiency Low Vitamin D level contributes to fatigue and are associated with obesity, breast, and colon cancer. She agrees to continue to take prescription Vitamin D 50,000 IU every week and will follow-up for routine testing of Vitamin D, at least 2-3 times per year to avoid over-replacement.  Refill- Vitamin D,  Ergocalciferol, (DRISDOL) 1.25 MG (50000 UNIT) CAPS capsule; Take 1 capsule (50,000 Units total) by mouth every 7 (seven) days.  Dispense: 4 capsule; Refill: 0  3. At risk for hypertension Alexandria Mack was given approximately 15 minutes of hypertension prevention counseling today. Alexandria Mack is at risk for hypertension due to obesity. We discussed intensive lifestyle modifications today with an emphasis on weight loss as well as increasing exercise and decreasing salt intake.  Repetitive spaced learning was employed today to elicit superior memory formation and behavioral change.   4. Obesity with current BMI of 38.1  Alexandria Mack is currently in the action stage of change. As such, her goal is to continue with weight loss efforts. She has agreed to the Category 2 Plan and the [place].   Exercise goals:  Pt is to continue walking, but will think about where to add resistance training and what training she will do.  Behavioral modification strategies: increasing lean protein intake, meal planning and cooking strategies, keeping healthy foods in the home, and celebration eating strategies.  Alexandria Mack has agreed to follow-up with our clinic in 3 weeks. She was informed of the importance of frequent follow-up visits to maximize her success with intensive lifestyle modifications for her multiple health conditions.   Objective:   Blood pressure 117/75, pulse (!) 49, temperature 98 F (36.7 C), height 4\' 11"  (1.499 m), weight 188 lb (85.3 kg), last menstrual period 08/16/2007. Body mass index is 37.97 kg/m.  General: Cooperative, alert, well developed, in no acute distress. HEENT: Conjunctivae and lids unremarkable. Cardiovascular: Regular rhythm.  Lungs:  Normal work of breathing. Neurologic: No focal deficits.   Lab Results  Component Value Date   CREATININE 0.85 02/02/2021   BUN 19 02/02/2021   NA 142 02/02/2021   K 4.5 02/02/2021   CL 103 02/02/2021   CO2 24 02/02/2021    Lab Results  Component Value Date   ALT 12 02/02/2021   AST 16 02/02/2021   ALKPHOS 87 02/02/2021   BILITOT 0.6 02/02/2021   Lab Results  Component Value Date   HGBA1C 5.7 (H) 08/02/2019   Lab Results  Component Value Date   INSULIN 12.6 02/02/2021   Lab Results  Component Value Date   TSH 1.540 02/02/2021   Lab Results  Component Value Date   CHOL 177 02/02/2021   HDL 75 02/02/2021   LDLCALC 86 02/02/2021   LDLDIRECT 103 (H) 08/02/2019   TRIG 88 02/02/2021   Lab Results  Component Value Date   VD25OH 37.5 02/02/2021   Lab Results  Component Value Date   WBC 5.6 07/23/2020   HGB 12.6 07/23/2020   HCT 42.6 07/23/2020   MCV 87.8 07/23/2020   PLT 281 07/23/2020   Attestation Statements:   Reviewed by clinician on day of visit: allergies, medications, problem list, medical history, surgical history, family history, social history, and previous encounter notes.  Coral Ceo, CMA, am acting as transcriptionist for Coralie Common, MD.   I have reviewed the above documentation for accuracy and completeness, and I agree with the above. - Coralie Common, MD

## 2021-05-26 DIAGNOSIS — R059 Cough, unspecified: Secondary | ICD-10-CM | POA: Diagnosis not present

## 2021-05-26 DIAGNOSIS — J040 Acute laryngitis: Secondary | ICD-10-CM | POA: Diagnosis not present

## 2021-05-26 DIAGNOSIS — Z20828 Contact with and (suspected) exposure to other viral communicable diseases: Secondary | ICD-10-CM | POA: Diagnosis not present

## 2021-06-02 ENCOUNTER — Ambulatory Visit (INDEPENDENT_AMBULATORY_CARE_PROVIDER_SITE_OTHER): Payer: BC Managed Care – PPO | Admitting: Family Medicine

## 2021-06-23 ENCOUNTER — Ambulatory Visit (INDEPENDENT_AMBULATORY_CARE_PROVIDER_SITE_OTHER): Payer: BC Managed Care – PPO | Admitting: Family Medicine

## 2021-06-24 ENCOUNTER — Other Ambulatory Visit: Payer: Self-pay | Admitting: Cardiology

## 2021-06-24 DIAGNOSIS — I10 Essential (primary) hypertension: Secondary | ICD-10-CM

## 2021-07-02 ENCOUNTER — Other Ambulatory Visit: Payer: Self-pay | Admitting: Cardiology

## 2021-07-02 DIAGNOSIS — I10 Essential (primary) hypertension: Secondary | ICD-10-CM

## 2021-08-09 ENCOUNTER — Other Ambulatory Visit (INDEPENDENT_AMBULATORY_CARE_PROVIDER_SITE_OTHER): Payer: Self-pay | Admitting: Family Medicine

## 2021-08-09 ENCOUNTER — Other Ambulatory Visit: Payer: Self-pay | Admitting: Cardiology

## 2021-08-09 DIAGNOSIS — I10 Essential (primary) hypertension: Secondary | ICD-10-CM

## 2021-08-09 DIAGNOSIS — E559 Vitamin D deficiency, unspecified: Secondary | ICD-10-CM

## 2021-09-03 DIAGNOSIS — I10 Essential (primary) hypertension: Secondary | ICD-10-CM | POA: Diagnosis not present

## 2021-09-03 DIAGNOSIS — E559 Vitamin D deficiency, unspecified: Secondary | ICD-10-CM | POA: Diagnosis not present

## 2021-09-03 DIAGNOSIS — E785 Hyperlipidemia, unspecified: Secondary | ICD-10-CM | POA: Diagnosis not present

## 2021-09-03 DIAGNOSIS — R7303 Prediabetes: Secondary | ICD-10-CM | POA: Diagnosis not present

## 2021-10-21 DIAGNOSIS — Z1231 Encounter for screening mammogram for malignant neoplasm of breast: Secondary | ICD-10-CM | POA: Diagnosis not present

## 2021-10-28 DIAGNOSIS — R928 Other abnormal and inconclusive findings on diagnostic imaging of breast: Secondary | ICD-10-CM | POA: Diagnosis not present

## 2021-10-28 DIAGNOSIS — N6321 Unspecified lump in the left breast, upper outer quadrant: Secondary | ICD-10-CM | POA: Diagnosis not present

## 2021-11-11 ENCOUNTER — Other Ambulatory Visit: Payer: Self-pay | Admitting: Radiology

## 2021-11-11 DIAGNOSIS — N6012 Diffuse cystic mastopathy of left breast: Secondary | ICD-10-CM | POA: Diagnosis not present

## 2021-11-11 DIAGNOSIS — N6321 Unspecified lump in the left breast, upper outer quadrant: Secondary | ICD-10-CM | POA: Diagnosis not present

## 2021-11-18 DIAGNOSIS — R7303 Prediabetes: Secondary | ICD-10-CM | POA: Diagnosis not present

## 2021-11-18 DIAGNOSIS — I10 Essential (primary) hypertension: Secondary | ICD-10-CM | POA: Diagnosis not present

## 2021-11-18 DIAGNOSIS — J019 Acute sinusitis, unspecified: Secondary | ICD-10-CM | POA: Diagnosis not present

## 2021-11-18 DIAGNOSIS — I700 Atherosclerosis of aorta: Secondary | ICD-10-CM | POA: Diagnosis not present

## 2021-12-24 DIAGNOSIS — F419 Anxiety disorder, unspecified: Secondary | ICD-10-CM | POA: Diagnosis not present

## 2021-12-24 DIAGNOSIS — I10 Essential (primary) hypertension: Secondary | ICD-10-CM | POA: Diagnosis not present

## 2021-12-24 DIAGNOSIS — R002 Palpitations: Secondary | ICD-10-CM | POA: Diagnosis not present

## 2022-01-31 ENCOUNTER — Other Ambulatory Visit: Payer: Self-pay | Admitting: Nurse Practitioner

## 2022-01-31 ENCOUNTER — Other Ambulatory Visit (HOSPITAL_COMMUNITY)
Admission: RE | Admit: 2022-01-31 | Discharge: 2022-01-31 | Disposition: A | Discharge status: Home / Self Care | Payer: BC Managed Care – PPO | Source: Ambulatory Visit | Attending: Nurse Practitioner | Admitting: Nurse Practitioner

## 2022-01-31 DIAGNOSIS — Z124 Encounter for screening for malignant neoplasm of cervix: Secondary | ICD-10-CM | POA: Diagnosis not present

## 2022-01-31 DIAGNOSIS — Z01419 Encounter for gynecological examination (general) (routine) without abnormal findings: Secondary | ICD-10-CM | POA: Diagnosis not present

## 2022-02-03 DIAGNOSIS — Z780 Asymptomatic menopausal state: Secondary | ICD-10-CM | POA: Diagnosis not present

## 2022-02-03 DIAGNOSIS — M8589 Other specified disorders of bone density and structure, multiple sites: Secondary | ICD-10-CM | POA: Diagnosis not present

## 2022-02-04 LAB — CYTOLOGY - PAP
Comment: NEGATIVE
Comment: NEGATIVE
Comment: NEGATIVE
Diagnosis: NEGATIVE
HPV 16: NEGATIVE
HPV 18 / 45: NEGATIVE
High risk HPV: POSITIVE — AB

## 2022-03-23 ENCOUNTER — Encounter (INDEPENDENT_AMBULATORY_CARE_PROVIDER_SITE_OTHER): Payer: Self-pay

## 2022-04-01 DIAGNOSIS — R7303 Prediabetes: Secondary | ICD-10-CM | POA: Diagnosis not present

## 2022-04-01 DIAGNOSIS — I10 Essential (primary) hypertension: Secondary | ICD-10-CM | POA: Diagnosis not present

## 2022-05-05 ENCOUNTER — Encounter (HOSPITAL_COMMUNITY): Payer: Self-pay | Admitting: *Deleted

## 2022-05-05 ENCOUNTER — Ambulatory Visit (HOSPITAL_COMMUNITY)
Admission: EM | Admit: 2022-05-05 | Discharge: 2022-05-05 | Disposition: A | Discharge status: Home / Self Care | Payer: BC Managed Care – PPO | Attending: Physician Assistant | Admitting: Physician Assistant

## 2022-05-05 DIAGNOSIS — R051 Acute cough: Secondary | ICD-10-CM | POA: Diagnosis not present

## 2022-05-05 DIAGNOSIS — J069 Acute upper respiratory infection, unspecified: Secondary | ICD-10-CM | POA: Diagnosis not present

## 2022-05-05 DIAGNOSIS — R509 Fever, unspecified: Secondary | ICD-10-CM | POA: Insufficient documentation

## 2022-05-05 DIAGNOSIS — Z1152 Encounter for screening for COVID-19: Secondary | ICD-10-CM | POA: Insufficient documentation

## 2022-05-05 LAB — RESP PANEL BY RT-PCR (FLU A&B, COVID) ARPGX2
Influenza A by PCR: NEGATIVE
Influenza B by PCR: NEGATIVE
SARS Coronavirus 2 by RT PCR: NEGATIVE

## 2022-05-05 NOTE — Discharge Instructions (Addendum)
Advised to use Tylenol or Aleve for fever aches and pains. COVID and flu test will be completed in 24 hours or less.  If you do not get a call from this office that indicates the test is negative.  You can go on MyChart to view the results when it post in 24 hours or less. Advised to follow-up with PCP or return to urgent care if symptoms fail to improve.

## 2022-05-05 NOTE — ED Triage Notes (Signed)
Pt says Thursday she started with cough, congestion, sore throat. Today she started with SOB, heart palpations today, she isnt sure if the palpations are due to anxiety of knowing she was exposed. She was exposed to [place] on Saturday to several people who had COVID at a birthday. She hasn't taken anything for her sx.  Headache worse this morning when she woke up and sinus pressure.

## 2022-05-05 NOTE — ED Provider Notes (Signed)
[place]    CSN: [id number] Arrival date & time: 05/05/22  1028      History   Chief Complaint Chief Complaint  Patient presents with   Nasal Congestion   Covid Exposure   Sore Throat    HPI Alexandria Mack is a 61 y.o. female.   62 year old female presents with cough, congestion, body aches and pain.  Patient indicates that on 'Sunday she went to a gathering for her birthday and there were a lot of people present at her party.  She indicates that afterwards she got a phone call indicating that several of the guest tested positive for COVID.  She indicates on Tuesday she has started having cough, chest congestion, production which is clear to yellow.  She relates she is also having upper respiratory congestion with rhinitis.  Sore throat and painful swallowing which has resolved.  She also indicates that she has been having mild intermittent fever, body aches, chills and sweats.  She indicates that yesterday she was having some palpitations of heart and mild shortness of breath but without wheezing.  She indicates she does have fibromyalgia she has been taken some Aleve for fever and pain relief.  She relates she has been vaccinated but only the initial series of 2.  She relates that she is tolerating fluids well, no nausea or vomiting.   Sore Throat    Past Medical History:  Diagnosis Date   Anxiety 08/15/2010   managed with medications    Back pain    Chest pain    Chronic pain syndrome    Constipation    Depression 08/15/2010   Diverticulitis    External hemorrhoid    Fibroid 08/15/2008   uterine fibroids   Fibromyalgia 08/15/2008   Gallstone    GERD (gastroesophageal reflux disease)    Hemorrhoids, internal    History of kidney stones    Hypertension 08/15/2002   Joint pain    Morbid obesity (HCC)    OSA (obstructive sleep apnea) 08/16/2011   no C Pap   Other fatigue    Palpitations    Prediabetes    Shortness of breath on exertion      Patient Active Problem List   Diagnosis Date Noted   Abnormal finding on GI tract imaging 10/29/2020   Chest pain 10/29/2020   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   Chronic idiopathic constipation 10/29/2020   Colon cancer screening 10/29/2020   Constipation 10/29/2020   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   Flatulence, eructation and gas pain 10/29/2020   Morbid obesity (HCC) 10/29/2020   Rectal bleeding 10/29/2020   Mediastinal adenopathy 07/24/2019   External hemorrhoid    Hemorrhoids, internal    Uterine fibroid 04/25/2013    Past Surgical History:  Procedure Laterality Date   APPENDECTOMY  age 33   COLONOSCOPY  08/15/2002   DILATION AND CURETTAGE OF UTERUS     19'$ 86, [place] Bilateral 08/15/1994   VAGINAL DELIVERY     x3    OB History     Gravida  3   Para  3   Term      Preterm      AB      Living  3      SAB      IAB      Ectopic      Multiple      Live Births  Home Medications    Prior to Admission medications   Medication Sig Start Date End Date Taking? Authorizing Provider  amLODipine (NORVASC) 5 MG tablet Take 1 tablet (5 mg total) by mouth daily. TAKE 1 TABLET BY MOUTH EVERY DAY IN THE EVENING 05/11/21  Yes Laqueta Linden, MD  hydrOXYzine (ATARAX) 25 MG tablet Take 25 mg by mouth 2 (two) times daily as needed. 01/18/22  Yes [provider]  nebivolol (BYSTOLIC) 10 MG tablet Take 1 tablet (10 mg total) by mouth daily. Patient taking differently: Take 10 mg by mouth daily. Pt takes 2.5 mg 03/16/21  Yes Adrian Prows, MD  spironolactone (ALDACTONE) 25 MG tablet TAKE 1 TABLET BY MOUTH EVERY DAY IN THE MORNING 07/02/21  Yes Adrian Prows, MD  Vitamin D, Ergocalciferol, (DRISDOL) 1.25 MG (50000 UNIT) CAPS capsule Take 1 capsule (50,000 Units total) by mouth every 7 (seven) days. 05/11/21  Yes Laqueta Linden, MD    Family History Family History  Problem Relation Age of Onset   Anxiety  disorder Mother    Depression Mother    Mental illness Mother    Diabetes Mother    Sleep apnea Father    Kidney disease Father    Hyperlipidemia Father    Hypertension Father    Alzheimer's disease Father    Heart disease Father    Diabetes Father    Breast cancer Sister 73       chemo, mastectomy on left   Diabetes Sister    Hypertension Sister    Hypertension Brother    Hypertension Brother    Hypertension Brother    Kidney disease Other    Depression Other    Anxiety disorder Other    Sleep apnea Other     Social History Social History   Tobacco Use   Smoking status: Never   Smokeless tobacco: Never  Vaping Use   Vaping Use: Never used  Substance Use Topics   Alcohol use: Not Currently   Drug use: Not Currently    Types: Marijuana    Comment: tried for treatment of fibromyalgia - stopped secondaryy to burning in mouth     Allergies   Anesthesia s-i-40 [propofol], Codeine, and Meloxicam   Review of Systems Review of Systems  Constitutional:  Positive for chills, fatigue and fever.  HENT:  Positive for postnasal drip and rhinorrhea.   Respiratory:  Positive for cough.      Physical Exam Triage Vital Signs ED Triage Vitals  Enc Vitals Group     BP 05/05/22 1147 (!) 142/99     Pulse Rate 05/05/22 1147 61     Resp 05/05/22 1147 18     Temp 05/05/22 1147 99.2 F (37.3 C)     Temp Source 05/05/22 1147 Oral     SpO2 05/05/22 1147 98 %     Weight --      Height --      Head Circumference --      Peak Flow --      Pain Score 05/05/22 1145 0     Pain Loc --      Pain Edu? --      Excl. in [place]? --    No data found.  Updated Vital Signs BP (!) 142/99 (BP Location: Right Arm)   Pulse 61   Temp 99.2 F (37.3 C) (Oral)   Resp 18   LMP 08/16/2007 (Approximate)   SpO2 98%   Visual Acuity Right Eye Distance:   Left  Eye Distance:   Bilateral Distance:    Right Eye Near:   Left Eye Near:    Bilateral Near:     Physical Exam Constitutional:       Appearance: She is well-developed.  HENT:     Right Ear: Tympanic membrane and ear canal normal.     Left Ear: Tympanic membrane and ear canal normal.     Mouth/Throat:     Mouth: Mucous membranes are moist.     Pharynx: Oropharynx is clear. No posterior oropharyngeal erythema.  Cardiovascular:     Rate and Rhythm: Normal rate and regular rhythm.     Heart sounds: Normal heart sounds.  Pulmonary:     Effort: Pulmonary effort is normal.     Breath sounds: Normal air entry. No wheezing, rhonchi or rales.  Lymphadenopathy:     Cervical: No cervical adenopathy.  Neurological:     Mental Status: She is alert.      UC Treatments / Results  Labs (all labs ordered are listed, but only abnormal results are displayed) Labs Reviewed  RESP PANEL BY RT-PCR (FLU A&B, COVID) [id number]    EKG   Radiology No results found.  Procedures Procedures (including critical care time)  Medications Ordered in UC Medications - No data to display  Initial Impression / Assessment and Plan / UC Course  I have reviewed the triage vital signs and the nursing notes.  Pertinent labs & imaging results that were available during my care of the patient were reviewed by me and considered in my medical decision making (see chart for details).       Plan: 1.  Advised to take Aleve or Tylenol as needed for fever and body ache relief. 2.  COVID and flu test is pending. 3.  Advised to increase fluid intake.  Final Clinical Impressions(s) / UC Diagnoses   Final diagnoses:  Viral upper respiratory tract infection  Acute cough  Encounter for screening for COVID-19  Fever, unspecified     Discharge Instructions      Advised to use Tylenol or Aleve for fever aches and pains. COVID and flu test will be completed in 24 hours or less.  If you do not get a call from this office that indicates the test is negative.  You can go on MyChart to view the results when it post in 24 hours or less. Advised  to follow-up with PCP or return to urgent care if symptoms fail to improve.    ED Prescriptions   None    PDMP not reviewed this encounter.   Nyoka Lint, PA-C 05/05/22 1248

## 2022-09-14 DIAGNOSIS — E7800 Pure hypercholesterolemia, unspecified: Secondary | ICD-10-CM | POA: Diagnosis not present

## 2022-09-14 DIAGNOSIS — Z0000 Encounter for general adult medical examination without abnormal findings: Secondary | ICD-10-CM | POA: Diagnosis not present

## 2022-09-14 DIAGNOSIS — R7303 Prediabetes: Secondary | ICD-10-CM | POA: Diagnosis not present

## 2022-09-14 DIAGNOSIS — I700 Atherosclerosis of aorta: Secondary | ICD-10-CM | POA: Diagnosis not present

## 2022-09-14 DIAGNOSIS — I10 Essential (primary) hypertension: Secondary | ICD-10-CM | POA: Diagnosis not present

## 2022-10-27 DIAGNOSIS — Z1231 Encounter for screening mammogram for malignant neoplasm of breast: Secondary | ICD-10-CM | POA: Diagnosis not present

## 2023-03-14 ENCOUNTER — Other Ambulatory Visit: Payer: Self-pay | Admitting: Nurse Practitioner

## 2023-03-14 ENCOUNTER — Other Ambulatory Visit (HOSPITAL_COMMUNITY)
Admission: RE | Admit: 2023-03-14 | Discharge: 2023-03-14 | Disposition: A | Discharge status: Home / Self Care | Payer: BC Managed Care – PPO | Source: Ambulatory Visit | Attending: Nurse Practitioner | Admitting: Nurse Practitioner

## 2023-03-14 DIAGNOSIS — Z124 Encounter for screening for malignant neoplasm of cervix: Secondary | ICD-10-CM | POA: Insufficient documentation

## 2023-03-14 DIAGNOSIS — Z01419 Encounter for gynecological examination (general) (routine) without abnormal findings: Secondary | ICD-10-CM | POA: Diagnosis not present

## 2023-03-17 LAB — CYTOLOGY - PAP
Comment: NEGATIVE
Comment: NEGATIVE
Comment: NEGATIVE
Diagnosis: UNDETERMINED — AB
HPV 16: NEGATIVE
HPV 18 / 45: NEGATIVE
High risk HPV: POSITIVE — AB

## 2023-03-21 DIAGNOSIS — I10 Essential (primary) hypertension: Secondary | ICD-10-CM | POA: Diagnosis not present

## 2023-03-21 DIAGNOSIS — E785 Hyperlipidemia, unspecified: Secondary | ICD-10-CM | POA: Diagnosis not present

## 2023-03-21 DIAGNOSIS — I700 Atherosclerosis of aorta: Secondary | ICD-10-CM | POA: Diagnosis not present

## 2023-03-21 DIAGNOSIS — R7303 Prediabetes: Secondary | ICD-10-CM | POA: Diagnosis not present

## 2023-04-13 ENCOUNTER — Other Ambulatory Visit: Payer: Self-pay

## 2023-04-13 DIAGNOSIS — R102 Pelvic and perineal pain: Secondary | ICD-10-CM | POA: Diagnosis not present

## 2023-04-13 DIAGNOSIS — N870 Mild cervical dysplasia: Secondary | ICD-10-CM | POA: Diagnosis not present

## 2023-04-19 DIAGNOSIS — Z20822 Contact with and (suspected) exposure to covid-19: Secondary | ICD-10-CM | POA: Diagnosis not present

## 2023-04-19 DIAGNOSIS — R059 Cough, unspecified: Secondary | ICD-10-CM | POA: Diagnosis not present

## 2023-04-19 DIAGNOSIS — U071 COVID-19: Secondary | ICD-10-CM | POA: Diagnosis not present

## 2023-05-02 DIAGNOSIS — R102 Pelvic and perineal pain: Secondary | ICD-10-CM | POA: Diagnosis not present

## 2023-10-13 ENCOUNTER — Other Ambulatory Visit: Payer: Self-pay

## 2023-10-13 ENCOUNTER — Emergency Department (HOSPITAL_COMMUNITY)
Admission: EM | Admit: 2023-10-13 | Discharge: 2023-10-13 | Disposition: A | Discharge status: Home / Self Care | Payer: BC Managed Care – PPO | Attending: Emergency Medicine | Admitting: Emergency Medicine

## 2023-10-13 ENCOUNTER — Encounter (HOSPITAL_COMMUNITY): Payer: Self-pay

## 2023-10-13 ENCOUNTER — Emergency Department (HOSPITAL_COMMUNITY): Payer: BC Managed Care – PPO

## 2023-10-13 ENCOUNTER — Telehealth: Payer: Self-pay | Admitting: Cardiology

## 2023-10-13 DIAGNOSIS — R079 Chest pain, unspecified: Secondary | ICD-10-CM | POA: Diagnosis not present

## 2023-10-13 DIAGNOSIS — I10 Essential (primary) hypertension: Secondary | ICD-10-CM | POA: Diagnosis not present

## 2023-10-13 DIAGNOSIS — Z79899 Other long term (current) drug therapy: Secondary | ICD-10-CM | POA: Diagnosis not present

## 2023-10-13 DIAGNOSIS — K30 Functional dyspepsia: Secondary | ICD-10-CM | POA: Diagnosis not present

## 2023-10-13 DIAGNOSIS — R002 Palpitations: Secondary | ICD-10-CM | POA: Insufficient documentation

## 2023-10-13 DIAGNOSIS — M542 Cervicalgia: Secondary | ICD-10-CM | POA: Diagnosis not present

## 2023-10-13 LAB — BASIC METABOLIC PANEL
Anion gap: 14 (ref 5–15)
BUN: 20 mg/dL (ref 8–23)
CO2: 24 mmol/L (ref 22–32)
Calcium: 10 mg/dL (ref 8.9–10.3)
Chloride: 104 mmol/L (ref 98–111)
Creatinine, Ser: 0.87 mg/dL (ref 0.44–1.00)
GFR, Estimated: >60 mL/min (ref >60)
Glucose, Bld: 102 mg/dL — ABNORMAL HIGH (ref 70–99)
Potassium: 4.3 mmol/L (ref 3.5–5.1)
Sodium: 142 mmol/L (ref 135–145)

## 2023-10-13 LAB — CBC
HCT: 41.6 % (ref 36.0–46.0)
Hemoglobin: 12.9 g/dL (ref 12.0–15.0)
MCH: 25.9 pg — ABNORMAL LOW (ref 26.0–34.0)
MCHC: 31 g/dL (ref 30.0–36.0)
MCV: 83.4 fL (ref 80.0–100.0)
Platelets: 264 10*3/uL (ref 150–400)
RBC: 4.99 MIL/uL (ref 3.87–5.11)
RDW: 13.2 % (ref 11.5–15.5)
WBC: 4.4 10*3/uL (ref 4.0–10.5)
nRBC: 0 % (ref 0.0–0.2)

## 2023-10-13 LAB — D-DIMER, QUANTITATIVE: D-Dimer, Quant: <0.27 ug{FEU}/mL (ref 0.00–0.50)

## 2023-10-13 LAB — TROPONIN I (HIGH SENSITIVITY): Troponin I (High Sensitivity): 4 ng/L (ref <18)

## 2023-10-13 NOTE — ED Provider Notes (Signed)
 Assume Care Medical Decision Making  Patient care of assumed from previous provider at change of shift. Please see the original provider note from this emergency department encounter for full history and physical.   Briefly, Alexandria Mack is a 63 y.o. female who presents with:  Clinical Course as of 10/13/23 1700  Fri Oct 13, 2023  1529 H/f left sided chest pain with lightheadedness and palpations. Lasts a few seconds and resolves spontaneously. Has been seen for similar in past by cardiology. Patient wonders if symptoms could be due to indigestion.  Patient also having some left sided chest pain. Not currently symptomatic. EKG reassuring. Ddimer negative. Troponins WNL. CKR reassuring. [ ]  f/u neck XR [RK]    Clinical Course User Index [RK] Caron Presume, MD     Please refer to the original provider's note for additional information regarding the care of Alexandria Mack.  Labs reviewed by myself and considered in medical decision making.  Imaging reviewed by myself and considered in medical decision making. Imaging final read interpreted by radiology.  Additional MDM/ED Course: Brief this is a 63 year old patient presenting with intermittent chest pain associated palpitations and lightheadedness that comes and goes and resolves spontaneously after a few seconds.  EKG without ischemic changes.  Troponin negative x 2.  Low suspicion for ACS. Chest x-ray without signs of infiltrate, edema, soft tissue injury or pneumothorax D-dimer is within normal this is a low suspicion for pulmonary embolism.  History and physical exam not suggestive of aortic dissection. No typical symptoms of chest pain radiating through to the back or other parts of the body.  No severe hypertension.  Symmetric bilateral radial pulses.  No associated neurologic deficits.  No associated abdominal or lower extremity symptoms.  No marfanoid features or evidence of underlying connective tissue disorder.  With no signs of mediastinal widening on chest X-ray, and otherwise low clinical suspicion, will not pursue diagnosis further.  X-ray of her C-spine shows no acute fracture or malalignment.. Given reassuring workup, believe patient is stable for discharge at this time. Will give cardiology referral for for Holter monitor given her intermittent palpitations.  Discussed this with patient patient and she voiced agreement with this plan.  Disposition:  I discussed the plan for discharge with the patient and/or their surrogate at bedside prior to discharge and they were in agreement with the plan and verbalized understanding of the return precautions provided. All questions answered to the best of my ability. Ultimately, the patient was discharged in stable condition with stable vital signs. I am reassured that they are capable of close follow up and good social support at home.   Clinical Impression:  1. Palpitations     Rx / DC Orders ED Discharge Orders          Ordered    Ambulatory referral to Cardiology       Comments: If you have not heard from the Cardiology office within the next 72 hours please call [phone].   10/13/23 1658            The plan for this patient was discussed with Dr. Rush Landmark, who voiced agreement and who oversaw evaluation and treatment of this patient.    Caron Presume, MD 10/13/23 1700    Tegeler, Canary Brim, MD 10/14/23 [phone]

## 2023-10-13 NOTE — Telephone Encounter (Signed)
   Pt c/o of Chest Pain: STAT if active CP, including tightness, pressure, jaw pain, radiating pain to shoulder/upper arm/back, CP unrelieved by Nitro. Symptoms reported of SOB, nausea, vomiting, sweating.  1. Are you having CP right now? yes    2. Are you experiencing any other symptoms (ex. SOB, nausea, vomiting, sweating)? palpitations   3. Is your CP continuous or coming and going? Coming and going   4. Have you taken Nitroglycerin? No    5. How long have you been experiencing CP? Yesterday evening    6. If NO CP at time of call then end call with telling Pt to call back or call 911 if Chest pain returns prior to return call from triage team.

## 2023-10-13 NOTE — ED Triage Notes (Signed)
 Pt states she was at work and felt lightheaded with some pain under her breast and in her neck. Pt states she had dizziness and a squeezing feeling in left chest that started about a week ago. Yesterday pain under left breast started being constant and has continued. Pt has had indigestion. Pt denies nausea or vomiting.

## 2023-10-13 NOTE — ED Notes (Signed)
 ..  The patient is A&OX4, ambulatory at d/c with independent steady gait, NAD. Pt verbalized understanding of d/c instructions and follow up care.

## 2023-10-13 NOTE — Discharge Instructions (Signed)
 Alexandria Mack:  Thank you for allowing [place] to take care of you today.  We hope you begin feeling better soon.  To-Do: Please follow-up with cardiologist.  Their office will call you to schedule an appointment. Please return to the Emergency Department or call 911 if you experience chest pain, shortness of breath, severe pain, severe fever, altered mental status, or have any reason to think that you need emergency medical care.  Thank you again.  Hope you feel better soon.  Department of Emergency Medicine Ama\

## 2023-10-13 NOTE — Telephone Encounter (Signed)
 Chart reviewed and symptoms reviewed and patient has not been seen since 2021 and it was documented palpitations and chest pain was under control. Advised to go to the local ER for an evaluation. Patient voiced understanding.

## 2023-10-13 NOTE — ED Provider Notes (Signed)
 [place] EMERGENCY DEPARTMENT AT [facility] Provider Note   CSN: [id number] Arrival date & time: 10/13/23  1009     History  Chief Complaint  Patient presents with   Chest Pain    Alexandria Mack is a 63 y.o. female with a history of fibromyalgia, chronic pain syndrome, hypertension, and palpitations who presents the ED today for chest pain.  Patient reports feeling pain underneath the left breast, palpitations, and lightheadedness intermittently for the past week.  Patient reports that the symptoms come about randomly and will only last a moment.  She cannot tell me if pain is worse with deep breath or with movement because the sensation does not last that long.  She states she had 3 episodes of this yesterday. She took Pepcid when she got home from work with improvement of symptoms.  States she has been having a lot of gas and does not know if her symptoms could be related to that, a blood clot, or anxiety.  She has seen cardiology several years ago for palpitations similar to what she is having now.  No additional complaints or concerns at this time.    Home Medications Prior to Admission medications   Medication Sig Start Date End Date Taking? Authorizing Provider  amLODipine (NORVASC) 5 MG tablet Take 1 tablet (5 mg total) by mouth daily. TAKE 1 TABLET BY MOUTH EVERY DAY IN THE EVENING 05/11/21   Langston Reusing, MD  hydrOXYzine (ATARAX) 25 MG tablet Take 25 mg by mouth 2 (two) times daily as needed. 01/18/22   [provider]  nebivolol (BYSTOLIC) 10 MG tablet Take 1 tablet (10 mg total) by mouth daily. Patient taking differently: Take 10 mg by mouth daily. Pt takes 2.5 mg 03/16/21   Yates Decamp, MD  spironolactone (ALDACTONE) 25 MG tablet TAKE 1 TABLET BY MOUTH EVERY DAY IN THE MORNING 07/02/21   Yates Decamp, MD  Vitamin D, Ergocalciferol, (DRISDOL) 1.25 MG (50000 UNIT) CAPS capsule Take 1 capsule (50,000 Units total) by mouth every 7 (seven) days. 05/11/21    Langston Reusing, MD      Allergies    Anesthesia s-i-40 [propofol], Codeine, and Meloxicam    Review of Systems   Review of Systems  Cardiovascular:  Positive for chest pain.  All other systems reviewed and are negative.   Physical Exam Updated Vital Signs BP (!) 157/91 (BP Location: Left Arm)   Pulse 64   Temp 98.9 F (37.2 C)   Resp 16   Ht 5' (1.524 m)   Wt 88.5 kg   LMP 08/16/2007 (Approximate)   SpO2 100%   BMI 38.08 kg/m  Physical Exam Vitals and nursing note reviewed.  Constitutional:      General: She is not in acute distress.    Appearance: Normal appearance.  HENT:     Head: Normocephalic and atraumatic.     Mouth/Throat:     Mouth: Mucous membranes are moist.  Eyes:     Conjunctiva/sclera: Conjunctivae normal.     Pupils: Pupils are equal, round, and reactive to light.  Cardiovascular:     Rate and Rhythm: Normal rate and regular rhythm.     Pulses: Normal pulses.     Heart sounds: Normal heart sounds.  Pulmonary:     Effort: Pulmonary effort is normal.     Breath sounds: Normal breath sounds.  Abdominal:     Palpations: Abdomen is soft.     Tenderness: There is no abdominal tenderness.  Musculoskeletal:        General: Normal range of motion.  Skin:    General: Skin is warm and dry.     Findings: No rash.  Neurological:     General: No focal deficit present.     Mental Status: She is alert.  Psychiatric:        Mood and Affect: Mood normal.        Behavior: Behavior normal.    ED Results / Procedures / Treatments   Labs (all labs ordered are listed, but only abnormal results are displayed) Labs Reviewed  BASIC METABOLIC PANEL - Abnormal; Notable for the following components:      Result Value   Glucose, Bld 102 (*)    All other components within normal limits  CBC - Abnormal; Notable for the following components:   MCH 25.9 (*)    All other components within normal limits  D-DIMER, QUANTITATIVE  TROPONIN I (HIGH SENSITIVITY)     EKG EKG Interpretation Date/Time:  Friday October 13 2023 10:18:21 EST Ventricular Rate:  65 PR Interval:  132 QRS Duration:  82 QT Interval:  412 QTC Calculation: 428 R Axis:   41  Text Interpretation: Normal sinus rhythm Nonspecific ST abnormality No significant change since last tracing When compared with ECG of 18-Jul-2019 12:21, PREVIOUS ECG IS PRESENT Confirmed by Gwyneth Sprout ([id number]) on 10/13/2023 1:43:59 PM  Radiology DG Chest 2 View Result Date: 10/13/2023 CLINICAL DATA:  Chest pain, indigestion symptoms.  Neck pain. EXAM: CHEST - 2 VIEW COMPARISON:  Radiographs 08/13/2022 and 09/04/2018. Chest CT 04/15/2021. FINDINGS: The heart size and mediastinal contours are normal. The lungs are clear. There is no pleural effusion or pneumothorax. No acute osseous findings are identified. Mild degenerative changes in the spine. IMPRESSION: No evidence of acute cardiopulmonary process. Electronically Signed   By: Carey Bullocks M.D.   On: 10/13/2023 12:14    Procedures Procedures: not indicated.    Medications Ordered in ED Medications - No data to display  ED Course/ Medical Decision Making/ A&P Clinical Course as of 10/13/23 1539  Fri Oct 13, 2023  1529 H/f left sided chest pain with lightheadedness and palpations. Lasts a few seconds and resolves spontaneously. Has been seen for similar in past by cardiology. Patient wonders if symptoms could be due to indigestion.  Patient also having some left sided chest pain. Not currently symptomatic. EKG reassuring. Ddimer negative. Troponins WNL. CKR reassuring. [ ]  f/u neck XR [RK]    Clinical Course User Index [RK] Caron Presume, MD                                 Medical Decision Making Amount and/or Complexity of Data Reviewed Labs: ordered. Radiology: ordered.   This patient presents to the ED for concern of chest pain, this involves an extensive number of treatment options, and is a complaint that carries with  it a high risk of complications and morbidity.   Differential diagnosis includes: ACS, PE, pleural effusion, costochondritis, pleurisy, anxiety, indigestion, orthostatic hypotension, dehydration, electrolyte derangement, etc.  Comorbidities  See HPI above   Additional History  Additional history obtained from prior records   Cardiac Monitoring / EKG  The patient was maintained on a cardiac monitor.  I personally viewed and interpreted the cardiac monitored which showed: NSR with a heart rate of 65 bpm.   Lab Tests  I ordered and personally interpreted labs.  The pertinent results include:   Troponin of 4 BMP and CBC are unremarkable Negative D-dimer   Imaging Studies  I ordered imaging studies including CXR and cervical spine x-ray I independently visualized and interpreted imaging which showed:  No acute cardiopulmonary changes of the spine Cervical spine x-ray pending at shift change I agree with the radiologist interpretation   Problem List / ED Course / Critical Interventions / Medication Management  Left-sided chest pain under the breast with associated palpitations and lightheadedness intermittently for the past week.  Denies any triggers prior to onset of symptoms.  She does not have any pain at the time of evaluation.  States that she had similar symptoms several years ago for which she followed up with cardiology for and everything turned out normal.  Also endorses some left-sided neck pain for the past 5 days after cracking her neck.  No headaches or neurologic focal deficits. Patient states that she recently increased her caffeine intake and does not drink much water throughout the day.  Symptoms can be secondary to dehydration or caffeine consumption.  Additionally, she states that she has been under a lot of stress at work and does not know if that could be causing some of her symptoms.  She also has been having increased digestion over the past week and does not  know if that could be associated with her new symptoms. Patient staffed with my attending, Dr. Anitra Lauth   Social Determinants of Health  Occupation    Test / Admission - Considered  Disposition pending cervical neck x-ray. Disposition taken over by oncoming provider at shift change.        Final Clinical Impression(s) / ED Diagnoses Final diagnoses:  Palpitations    Rx / DC Orders ED Discharge Orders     None         Maxwell Marion, PA-C 10/13/23 1539    Gwyneth Sprout, MD 10/15/23 1110

## 2023-11-02 DIAGNOSIS — Z1231 Encounter for screening mammogram for malignant neoplasm of breast: Secondary | ICD-10-CM | POA: Diagnosis not present

## 2023-11-17 DIAGNOSIS — Z0000 Encounter for general adult medical examination without abnormal findings: Secondary | ICD-10-CM | POA: Diagnosis not present

## 2023-11-17 DIAGNOSIS — R7303 Prediabetes: Secondary | ICD-10-CM | POA: Diagnosis not present

## 2023-11-17 DIAGNOSIS — Z23 Encounter for immunization: Secondary | ICD-10-CM | POA: Diagnosis not present

## 2023-11-17 DIAGNOSIS — I10 Essential (primary) hypertension: Secondary | ICD-10-CM | POA: Diagnosis not present

## 2023-11-17 DIAGNOSIS — E559 Vitamin D deficiency, unspecified: Secondary | ICD-10-CM | POA: Diagnosis not present

## 2023-11-17 DIAGNOSIS — E785 Hyperlipidemia, unspecified: Secondary | ICD-10-CM | POA: Diagnosis not present

## 2024-02-12 DIAGNOSIS — R350 Frequency of micturition: Secondary | ICD-10-CM | POA: Diagnosis not present

## 2024-02-12 DIAGNOSIS — M7918 Myalgia, other site: Secondary | ICD-10-CM | POA: Diagnosis not present

## 2024-03-26 DIAGNOSIS — Z01419 Encounter for gynecological examination (general) (routine) without abnormal findings: Secondary | ICD-10-CM | POA: Diagnosis not present

## 2024-03-26 DIAGNOSIS — N870 Mild cervical dysplasia: Secondary | ICD-10-CM | POA: Diagnosis not present

## 2024-05-09 DIAGNOSIS — L400 Psoriasis vulgaris: Secondary | ICD-10-CM | POA: Diagnosis not present

## 2024-05-09 DIAGNOSIS — D485 Neoplasm of uncertain behavior of skin: Secondary | ICD-10-CM | POA: Diagnosis not present

## 2024-05-09 DIAGNOSIS — L820 Inflamed seborrheic keratosis: Secondary | ICD-10-CM | POA: Diagnosis not present

## 2024-05-09 DIAGNOSIS — L821 Other seborrheic keratosis: Secondary | ICD-10-CM | POA: Diagnosis not present

## 2024-05-24 DIAGNOSIS — I10 Essential (primary) hypertension: Secondary | ICD-10-CM | POA: Diagnosis not present

## 2024-05-24 DIAGNOSIS — R7303 Prediabetes: Secondary | ICD-10-CM | POA: Diagnosis not present

## 2024-05-24 DIAGNOSIS — E785 Hyperlipidemia, unspecified: Secondary | ICD-10-CM | POA: Diagnosis not present
